# Patient Record
Sex: Female | Born: 1985 | Race: White | Hispanic: No | Smoking: Current every day smoker
Health system: Southern US, Community
[De-identification: ages and names within clinical notes are randomized; demographics above are authoritative.]

## PROBLEM LIST (undated history)

## (undated) DIAGNOSIS — N390 Urinary tract infection, site not specified: Secondary | ICD-10-CM

## (undated) DIAGNOSIS — J45909 Unspecified asthma, uncomplicated: Secondary | ICD-10-CM

## (undated) DIAGNOSIS — F3130 Bipolar disorder, current episode depressed, mild or moderate severity, unspecified: Secondary | ICD-10-CM

## (undated) DIAGNOSIS — F1910 Other psychoactive substance abuse, uncomplicated: Secondary | ICD-10-CM

## (undated) DIAGNOSIS — R011 Cardiac murmur, unspecified: Secondary | ICD-10-CM

## (undated) HISTORY — PX: EXPLORATORY LAPAROTOMY: SUR591

---
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
## 2006-05-23 ENCOUNTER — Emergency Department: Payer: Self-pay | Admitting: Emergency Medicine

## 2007-04-11 ENCOUNTER — Emergency Department: Payer: Self-pay | Admitting: Unknown Physician Specialty

## 2007-04-16 ENCOUNTER — Emergency Department: Payer: Self-pay | Admitting: Emergency Medicine

## 2007-07-09 ENCOUNTER — Emergency Department: Payer: Self-pay | Admitting: Emergency Medicine

## 2007-07-10 ENCOUNTER — Inpatient Hospital Stay: Payer: Self-pay | Admitting: Obstetrics and Gynecology

## 2007-07-23 ENCOUNTER — Ambulatory Visit: Payer: Self-pay | Admitting: Family Medicine

## 2007-09-12 ENCOUNTER — Inpatient Hospital Stay: Payer: Self-pay | Admitting: Obstetrics and Gynecology

## 2007-09-15 ENCOUNTER — Ambulatory Visit: Payer: Self-pay | Admitting: Obstetrics and Gynecology

## 2007-10-25 ENCOUNTER — Observation Stay: Payer: Self-pay

## 2007-11-07 ENCOUNTER — Observation Stay: Payer: Self-pay

## 2007-11-19 ENCOUNTER — Ambulatory Visit: Payer: Self-pay

## 2007-11-20 ENCOUNTER — Inpatient Hospital Stay: Payer: Self-pay

## 2008-03-06 ENCOUNTER — Emergency Department: Payer: Self-pay | Admitting: Internal Medicine

## 2009-05-10 ENCOUNTER — Emergency Department (HOSPITAL_COMMUNITY): Admission: EM | Admit: 2009-05-10 | Discharge: 2009-05-10 | Payer: Self-pay | Admitting: Emergency Medicine

## 2009-06-29 ENCOUNTER — Ambulatory Visit: Payer: Self-pay | Admitting: Obstetrics & Gynecology

## 2009-07-05 ENCOUNTER — Ambulatory Visit: Payer: Self-pay | Admitting: Obstetrics & Gynecology

## 2009-10-25 ENCOUNTER — Emergency Department: Payer: Self-pay | Admitting: Internal Medicine

## 2009-11-23 ENCOUNTER — Ambulatory Visit: Payer: Self-pay | Admitting: Obstetrics & Gynecology

## 2009-12-06 ENCOUNTER — Ambulatory Visit: Payer: Self-pay | Admitting: Obstetrics & Gynecology

## 2010-02-13 ENCOUNTER — Emergency Department: Payer: Self-pay | Admitting: Emergency Medicine

## 2010-08-21 LAB — BASIC METABOLIC PANEL
Calcium: 8.7 mg/dL (ref 8.4–10.5)
GFR calc non Af Amer: >60 mL/min (ref >60)
Potassium: 3.6 mEq/L (ref 3.5–5.1)
Sodium: 139 mEq/L (ref 135–145)

## 2010-08-21 LAB — DIFFERENTIAL
Basophils Relative: 0 % (ref 0–1)
Lymphocytes Relative: 19 % (ref 12–46)
Monocytes Absolute: 0.9 10*3/uL (ref 0.1–1.0)
Monocytes Relative: 8 % (ref 3–12)
Neutro Abs: 8.1 10*3/uL — ABNORMAL HIGH (ref 1.7–7.7)
Neutrophils Relative %: 72 % (ref 43–77)

## 2010-08-21 LAB — CBC
Hemoglobin: 13.3 g/dL (ref 12.0–15.0)
RBC: 4.04 MIL/uL (ref 3.87–5.11)
WBC: 11.2 10*3/uL — ABNORMAL HIGH (ref 4.0–10.5)

## 2010-08-21 LAB — URINALYSIS, ROUTINE W REFLEX MICROSCOPIC
Bilirubin Urine: NEGATIVE
Glucose, UA: NEGATIVE mg/dL
Hgb urine dipstick: NEGATIVE
Ketones, ur: NEGATIVE mg/dL
Nitrite: NEGATIVE
pH: 7.5 (ref 5.0–8.0)

## 2010-08-21 LAB — POCT PREGNANCY, URINE

## 2010-08-21 LAB — URINE MICROSCOPIC-ADD ON

## 2011-06-15 ENCOUNTER — Emergency Department (INDEPENDENT_AMBULATORY_CARE_PROVIDER_SITE_OTHER)
Admission: EM | Admit: 2011-06-15 | Discharge: 2011-06-15 | Disposition: A | Discharge status: Home / Self Care | Payer: Medicaid Other | Source: Home / Self Care | Attending: Family Medicine | Admitting: Family Medicine

## 2011-06-15 ENCOUNTER — Encounter (HOSPITAL_COMMUNITY): Payer: Self-pay | Admitting: *Deleted

## 2011-06-15 DIAGNOSIS — N390 Urinary tract infection, site not specified: Secondary | ICD-10-CM

## 2011-06-15 HISTORY — DX: Urinary tract infection, site not specified: N39.0

## 2011-06-15 LAB — POCT URINALYSIS DIP (DEVICE)
Bilirubin Urine: NEGATIVE
Ketones, ur: NEGATIVE mg/dL
Protein, ur: 100 mg/dL — AB
Specific Gravity, Urine: 1.02 (ref 1.005–1.030)
pH: 8.5 — ABNORMAL HIGH (ref 5.0–8.0)

## 2011-06-15 LAB — POCT PREGNANCY, URINE: Preg Test, Ur: NEGATIVE

## 2011-06-15 MED ORDER — KETOROLAC TROMETHAMINE 30 MG/ML IJ SOLN
30.0000 mg | Freq: Once | INTRAMUSCULAR | Status: AC
  Administered 2011-06-15: 30 mg via INTRAMUSCULAR

## 2011-06-15 MED ORDER — KETOROLAC TROMETHAMINE 30 MG/ML IJ SOLN
INTRAMUSCULAR | Status: AC
  Filled 2011-06-15: qty 1

## 2011-06-15 MED ORDER — CEFTRIAXONE SODIUM 1 G IJ SOLR
INTRAMUSCULAR | Status: AC
  Filled 2011-06-15: qty 10

## 2011-06-15 MED ORDER — ONDANSETRON HCL 4 MG PO TABS: 4.0000 mg | ORAL_TABLET | Freq: Four times a day (QID) | ORAL | Status: AC

## 2011-06-15 MED ORDER — ONDANSETRON 4 MG PO TBDP
ORAL_TABLET | ORAL | Status: AC
  Filled 2011-06-15: qty 1

## 2011-06-15 MED ORDER — CEFTRIAXONE SODIUM 1 G IJ SOLR
1.0000 g | Freq: Once | INTRAMUSCULAR | Status: AC
  Administered 2011-06-15: 1 g via INTRAMUSCULAR

## 2011-06-15 MED ORDER — ONDANSETRON 4 MG PO TBDP
4.0000 mg | ORAL_TABLET | Freq: Once | ORAL | Status: AC
  Administered 2011-06-15: 4 mg via ORAL

## 2011-06-15 MED ORDER — CEPHALEXIN 500 MG PO CAPS: 500.0000 mg | ORAL_CAPSULE | Freq: Four times a day (QID) | ORAL | Status: AC

## 2011-06-15 NOTE — ED Provider Notes (Signed)
History     CSN: [id number]  Arrival date & time 06/15/11  1128   First MD Initiated Contact with Patient 06/15/11 1150      Chief Complaint  Patient presents with  . Urinary Tract Infection    (Consider location/radiation/quality/duration/timing/severity/associated sxs/prior treatment) Patient is a 26 y.o. female presenting with urinary tract infection. The history is provided by the patient.  Urinary Tract Infection This is a new problem. The current episode started more than 2 days ago. The problem occurs constantly. The problem has been gradually worsening. Associated symptoms include abdominal pain.    Past Medical History  Diagnosis Date  . UTI (lower urinary tract infection)     Past Surgical History  Procedure Date  . Cesarean section   . Exploratory laparotomy     History reviewed. No pertinent family history.  History  Substance Use Topics  . Smoking status: Current Everyday Smoker  . Smokeless tobacco: Not on file  . Alcohol Use: No    OB History    Grav Para Term Preterm Abortions TAB SAB Ect Mult Living                  Review of Systems  Constitutional: Positive for chills and appetite change. Negative for fever.  Gastrointestinal: Positive for nausea, vomiting and abdominal pain.  Genitourinary: Positive for dysuria, urgency, frequency and hematuria. Negative for vaginal bleeding, vaginal discharge and vaginal pain.  Skin: Negative.     Allergies  Review of patient's allergies indicates no known allergies.  Home Medications   Current Outpatient Rx  Name Route Sig Dispense Refill  . CEPHALEXIN 500 MG PO CAPS Oral Take 1 capsule (500 mg total) by mouth 4 (four) times daily. Take all of medicine and drink lots of fluids 20 capsule 0  . ONDANSETRON HCL 4 MG PO TABS Oral Take 1 tablet (4 mg total) by mouth every 6 (six) hours. As needed for n/v 6 tablet 0    BP 113/70  Pulse 74  Temp(Src) 98 F (36.7 C) (Oral)  Resp 18  SpO2  99%  Physical Exam  Nursing note and vitals reviewed. Constitutional: She appears well-developed and well-nourished.  HENT:  Head: Normocephalic.  Mouth/Throat: Oropharynx is clear and moist.  Abdominal: Soft. Bowel sounds are normal. She exhibits no mass. There is tenderness in the suprapubic area. There is no rigidity, no rebound, no guarding and no CVA tenderness.    ED Course  Procedures (including critical care time)  Labs Reviewed  POCT URINALYSIS DIP (DEVICE) - Abnormal; Notable for the following:    Hgb urine dipstick MODERATE (*)    pH 8.5 (*)    Protein, ur 100 (*)    Leukocytes, UA SMALL (*) Biochemical Testing Only. Please order routine urinalysis from main lab if confirmatory testing is needed.   All other components within normal limits  POCT PREGNANCY, URINE  POCT URINALYSIS DIPSTICK  POCT PREGNANCY, URINE   No results found.   1. UTI (lower urinary tract infection)       MDM          Barkley Bruns, MD 06/15/11 [phone]

## 2011-06-15 NOTE — ED Notes (Signed)
Pt  Has  Sym[ptoms  Of  Burning  frequencty  Of  Urination  As  Well  As  Low  Back  Pain     X  5  Days

## 2011-10-23 ENCOUNTER — Emergency Department: Payer: Self-pay | Admitting: Emergency Medicine

## 2011-10-23 LAB — URINALYSIS, COMPLETE
Bilirubin,UR: NEGATIVE
Ph: 6 (ref 4.5–8.0)
Specific Gravity: 1.023 (ref 1.003–1.030)
Squamous Epithelial: 6
WBC UR: 6 /HPF (ref 0–5)

## 2011-10-23 LAB — CBC
MCH: 32.8 pg (ref 26.0–34.0)
MCHC: 34.1 g/dL (ref 32.0–36.0)
MCV: 96 fL (ref 80–100)
RBC: 4.39 10*6/uL (ref 3.80–5.20)
WBC: 6.7 10*3/uL (ref 3.6–11.0)

## 2011-10-23 LAB — COMPREHENSIVE METABOLIC PANEL
Anion Gap: 8 (ref 7–16)
Bilirubin,Total: 0.6 mg/dL (ref 0.2–1.0)
Chloride: 106 mmol/L (ref 98–107)
Creatinine: 0.61 mg/dL (ref 0.60–1.30)
EGFR (African American): >60
Glucose: 83 mg/dL (ref 65–99)
Osmolality: 277 (ref 275–301)
SGOT(AST): 22 U/L (ref 15–37)
Sodium: 139 mmol/L (ref 136–145)
Total Protein: 7.4 g/dL (ref 6.4–8.2)

## 2012-01-09 ENCOUNTER — Emergency Department: Payer: Self-pay | Admitting: *Deleted

## 2012-01-09 LAB — URINALYSIS, COMPLETE
Nitrite: NEGATIVE
Protein: NEGATIVE
Specific Gravity: 1.004 (ref 1.003–1.030)
WBC UR: 184 /HPF (ref 0–5)

## 2012-01-09 LAB — BASIC METABOLIC PANEL
Anion Gap: 5 — ABNORMAL LOW (ref 7–16)
BUN: 15 mg/dL (ref 7–18)
Creatinine: 0.68 mg/dL (ref 0.60–1.30)
EGFR (African American): >60
Glucose: 88 mg/dL (ref 65–99)
Osmolality: 282 (ref 275–301)

## 2012-01-09 LAB — CBC
HCT: 41.6 % (ref 35.0–47.0)
Platelet: 208 10*3/uL (ref 150–440)

## 2012-01-11 LAB — URINE CULTURE

## 2012-01-17 ENCOUNTER — Emergency Department: Payer: Self-pay | Admitting: Emergency Medicine

## 2012-01-17 LAB — URINALYSIS, COMPLETE
Blood: NEGATIVE
Glucose,UR: NEGATIVE mg/dL (ref 0–75)
Ketone: NEGATIVE
Nitrite: NEGATIVE
Specific Gravity: 1.02 (ref 1.003–1.030)
Squamous Epithelial: 13
WBC UR: 6 /HPF (ref 0–5)

## 2012-01-17 LAB — PREGNANCY, URINE: Pregnancy Test, Urine: NEGATIVE m[IU]/mL

## 2012-10-20 ENCOUNTER — Emergency Department: Payer: Self-pay | Admitting: Emergency Medicine

## 2012-10-21 LAB — COMPREHENSIVE METABOLIC PANEL
Albumin: 3.9 g/dL (ref 3.4–5.0)
Anion Gap: 5 — ABNORMAL LOW (ref 7–16)
BUN: 12 mg/dL (ref 7–18)
Bilirubin,Total: 0.3 mg/dL (ref 0.2–1.0)
Chloride: 109 mmol/L — ABNORMAL HIGH (ref 98–107)
Creatinine: 0.44 mg/dL — ABNORMAL LOW (ref 0.60–1.30)
EGFR (African American): >60
EGFR (Non-African Amer.): >60
Glucose: 84 mg/dL (ref 65–99)
SGOT(AST): 27 U/L (ref 15–37)
SGPT (ALT): 22 U/L (ref 12–78)
Sodium: 140 mmol/L (ref 136–145)

## 2012-10-21 LAB — CBC
HCT: 38.5 % (ref 35.0–47.0)
HGB: 13.4 g/dL (ref 12.0–16.0)
Platelet: 228 10*3/uL (ref 150–440)
RBC: 4.04 10*6/uL (ref 3.80–5.20)
WBC: 10.3 10*3/uL (ref 3.6–11.0)

## 2013-06-03 IMAGING — CT CT HEAD WITHOUT CONTRAST
1 series · 16 of 29 positions shown, 20 images · non-contrast
Comparison: none

REASON FOR EXAM: headache and dizzines eval
COMMENTS:

[Series 2: soft tissue · axial · 0.42mm/px · z∈[+336,+466]mm · 16 of 29 slices shown, 20 images]
[im 2/29  brain]
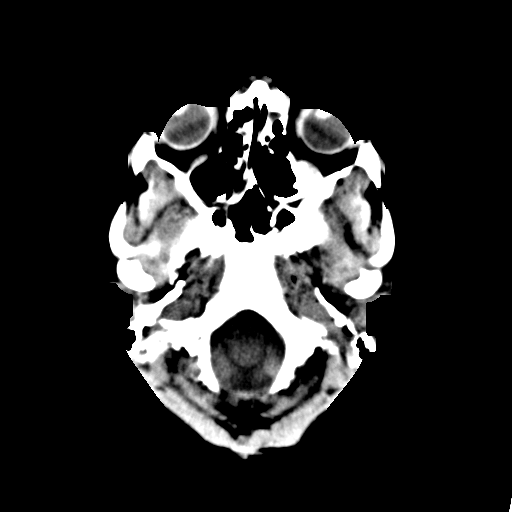
[im 2/29  bone]
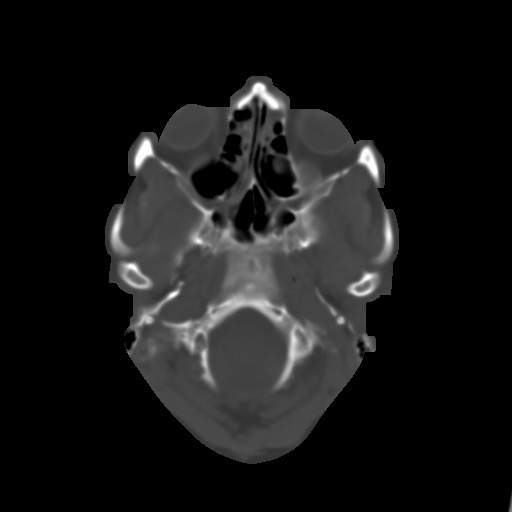
[im 4/29  brain]
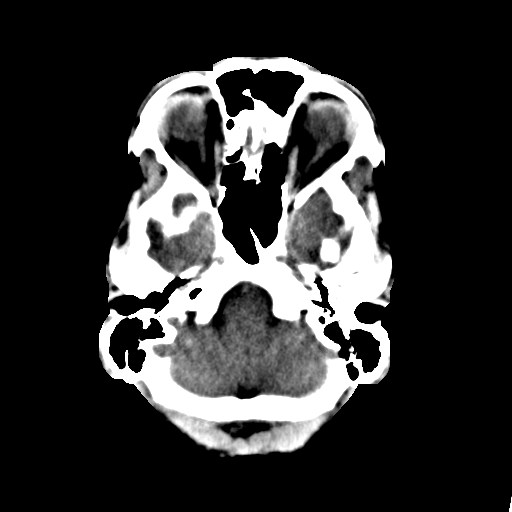
[im 6/29  brain]
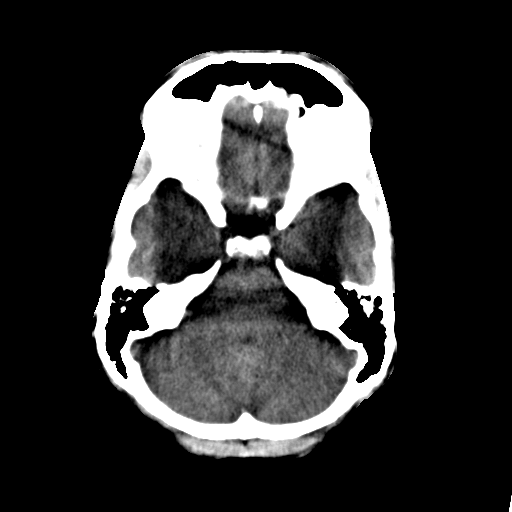
[im 7/29  brain]
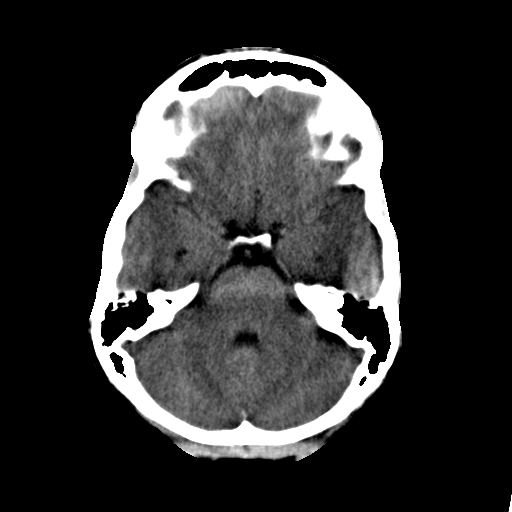
[im 9/29  brain]
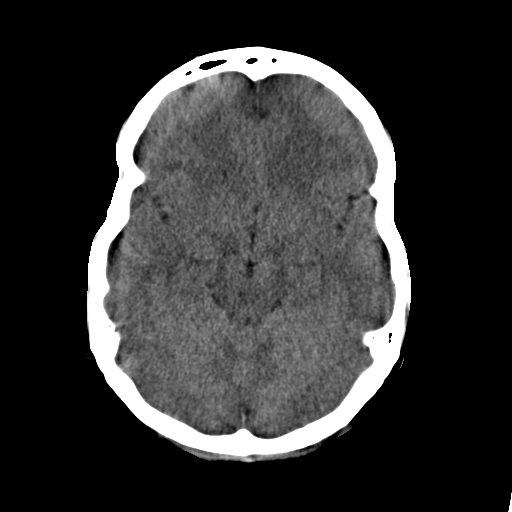
[im 9/29  bone]
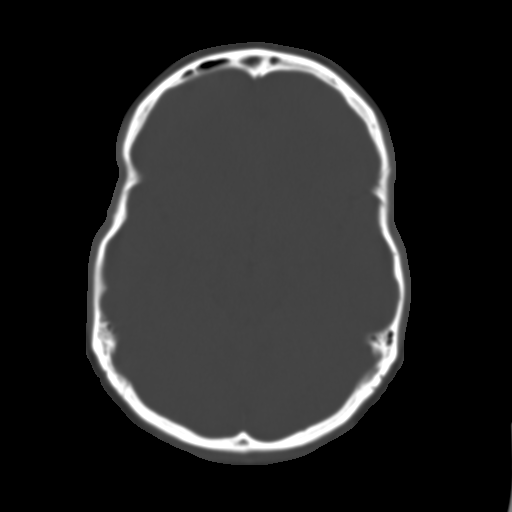
[im 11/29  brain]
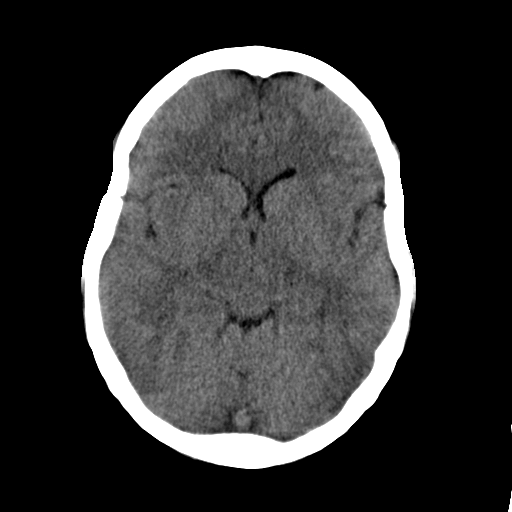
[im 12/29  brain]
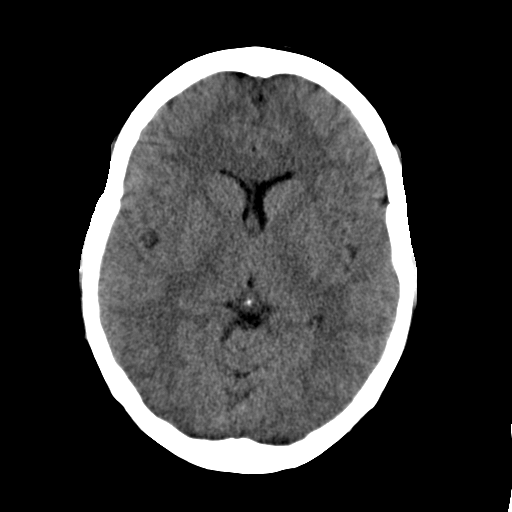
[im 14/29  brain]
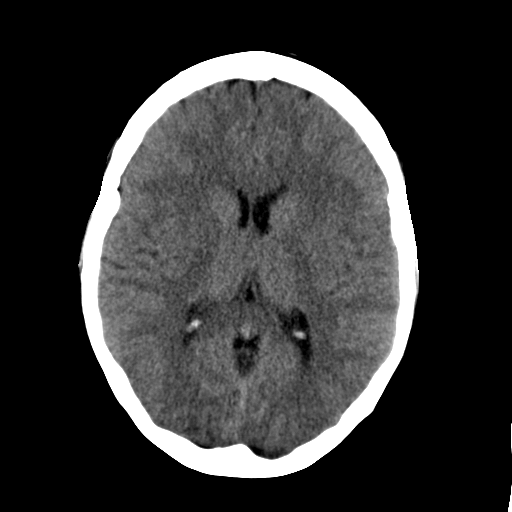
[im 16/29  brain]
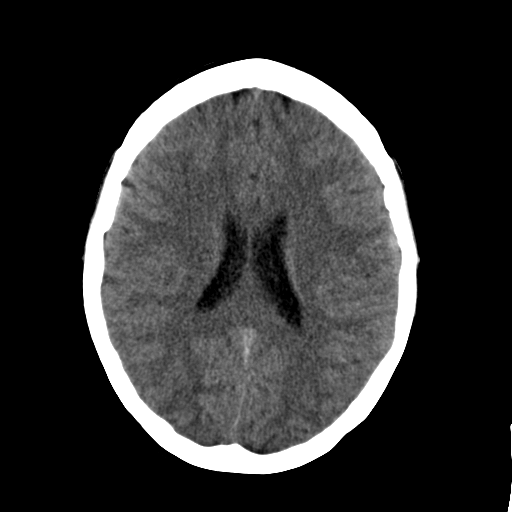
[im 16/29  bone]
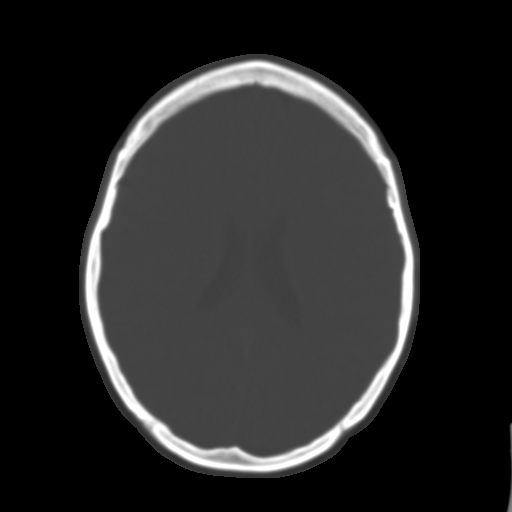
[im 18/29  brain]
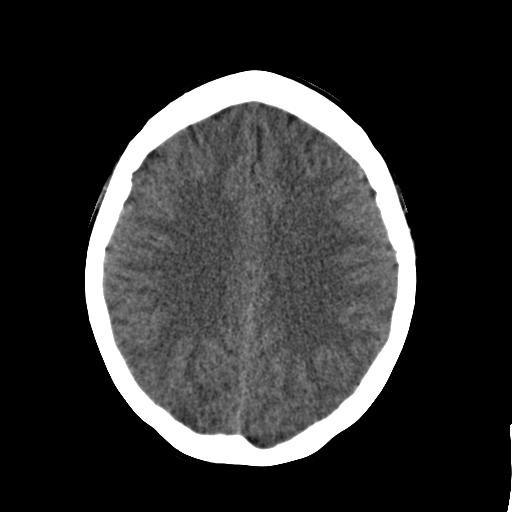
[im 19/29  brain]
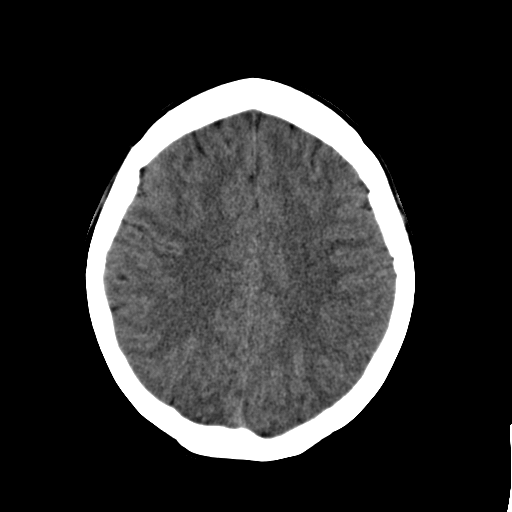
[im 21/29  brain]
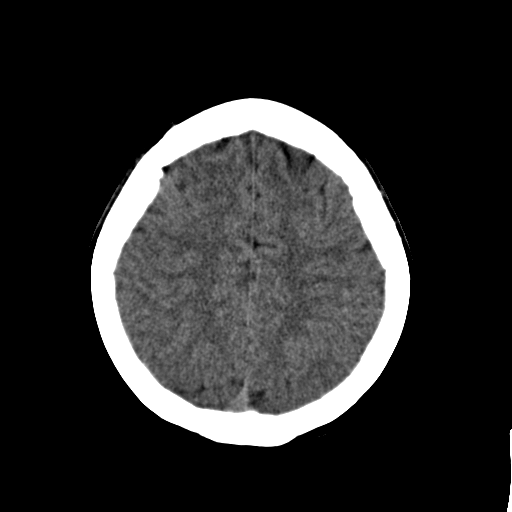
[im 23/29  brain]
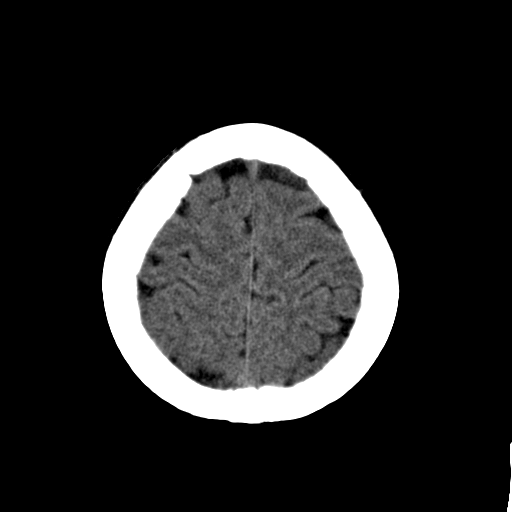
[im 23/29  bone]
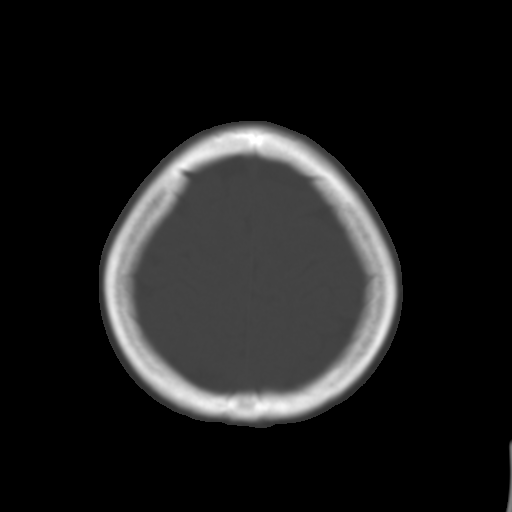
[im 24/29  brain]
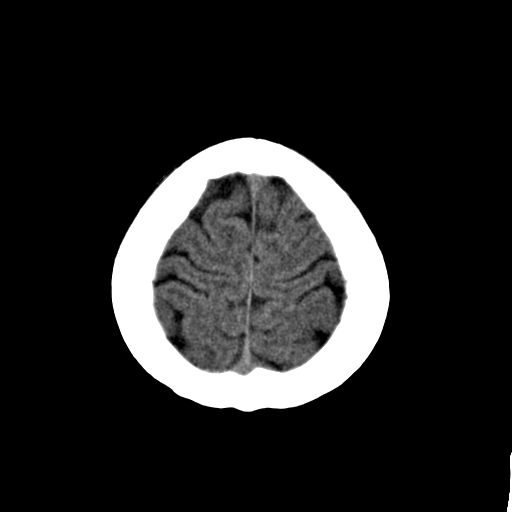
[im 26/29  brain]
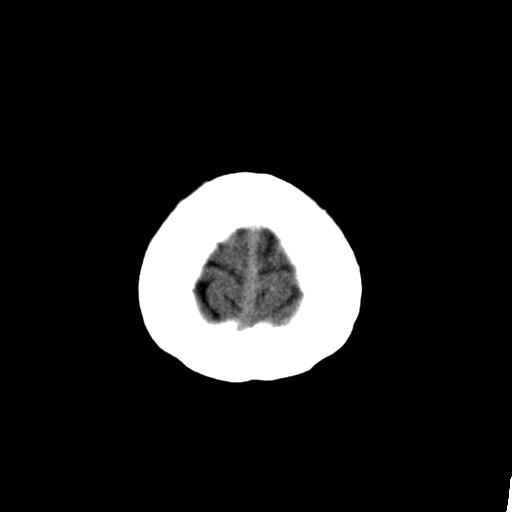
[im 28/29  brain]
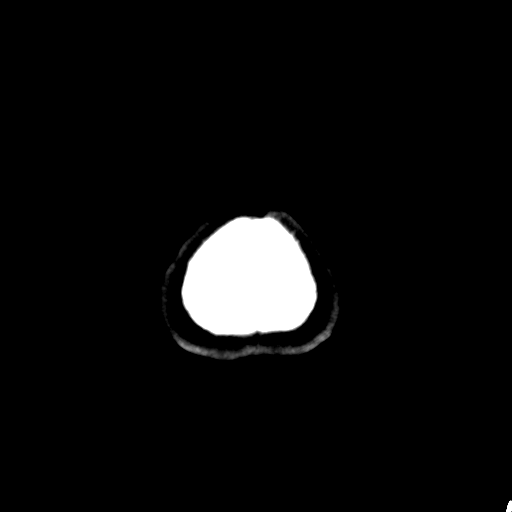

[16 of 29 positions shown; findings below may reference images not displayed]

PROCEDURE:     CT  - CT HEAD WITHOUT CONTRAST  - October 21, 2012  [DATE]

RESULT:     Axial noncontrast CT scanning was performed through the brain
with reconstructions at 5 mm intervals and slice thicknesses.

The ventricles are normal in size and position. There is no intracranial
hemorrhage nor intracranial mass effect. There is no evidence of an evolving
ischemic event. The cerebellum and brainstem are normal in density.

At bone window settings there is an air-fluid level in the left maxillary
sinus. The right maxillary sinus is clear. There is mucoperiosteal
thickening within the ethmoid sinus cells. The frontal and sphenoid and
mastoid sinus cells are clear. There is no evidence of an acute skull
fracture.
IMPRESSION: 1. The findings are consistent with acute sinusitis involving the left
maxillary sinus with an air-fluid level present. There is also inflammatory
change of the ethmoid sinuses.
2. No abnormality of the brain is demonstrated.

[REDACTED]

## 2014-09-18 ENCOUNTER — Emergency Department
Admit: 2014-09-18 | Disposition: A | Discharge status: Home / Self Care | Payer: Self-pay | Admitting: Emergency Medicine

## 2014-09-18 LAB — CBC WITH DIFFERENTIAL/PLATELET
BASOS ABS: 0 10*3/uL (ref 0.0–0.1)
Basophil %: 0.2 %
EOS ABS: 0.1 10*3/uL (ref 0.0–0.7)
Eosinophil %: 0.9 %
HCT: 45.3 % (ref 35.0–47.0)
HGB: 15.1 g/dL (ref 12.0–16.0)
Lymphocyte #: 2.9 10*3/uL (ref 1.0–3.6)
Lymphocyte %: 28.5 %
MCH: 30.9 pg (ref 26.0–34.0)
MCHC: 33.4 g/dL (ref 32.0–36.0)
MCV: 92 fL (ref 80–100)
Monocyte #: 0.9 x10 3/mm (ref 0.2–0.9)
Monocyte %: 8.7 %
Neutrophil #: 6.3 10*3/uL (ref 1.4–6.5)
Neutrophil %: 61.7 %
Platelet: 260 10*3/uL (ref 150–440)
RBC: 4.9 10*6/uL (ref 3.80–5.20)
RDW: 12.5 % (ref 11.5–14.5)
WBC: 10.1 10*3/uL (ref 3.6–11.0)

## 2014-09-18 LAB — COMPREHENSIVE METABOLIC PANEL
ALBUMIN: 5.2 g/dL — AB
ALT: 20 U/L
Alkaline Phosphatase: 88 U/L
Anion Gap: 7 (ref 7–16)
BUN: 14 mg/dL
Bilirubin,Total: 0.6 mg/dL
CALCIUM: 9.4 mg/dL
CHLORIDE: 105 mmol/L
CO2: 27 mmol/L
Creatinine: 0.68 mg/dL
EGFR (African American): >60
EGFR (Non-African Amer.): >60
Glucose: 64 mg/dL — ABNORMAL LOW
Potassium: 3.9 mmol/L
SGOT(AST): 21 U/L
SODIUM: 139 mmol/L
Total Protein: 8.3 g/dL — ABNORMAL HIGH

## 2014-09-18 LAB — URINALYSIS, COMPLETE
Bacteria: NONE SEEN
Bilirubin,UR: NEGATIVE
Blood: NEGATIVE
Glucose,UR: NEGATIVE mg/dL (ref 0–75)
Ketone: NEGATIVE
NITRITE: NEGATIVE
PH: 6 (ref 4.5–8.0)
Protein: NEGATIVE
Specific Gravity: 1.024 (ref 1.003–1.030)

## 2015-05-01 IMAGING — US US PELV - US TRANSVAGINAL
1 series · 13 of 25 positions shown · non-contrast
Comparison: None

CLINICAL DATA: Left lower quadrant pain for 1 month, history of 3
Cesarean sections, history of endometriosis

EXAM:
TRANSABDOMINAL AND TRANSVAGINAL ULTRASOUND OF PELVIS
TECHNIQUE: Both transabdominal and transvaginal ultrasound examinations of the
pelvis were performed. Transabdominal technique was performed for
global imaging of the pelvis including uterus, ovaries, adnexal
regions, and pelvic cul-de-sac. It was necessary to proceed with
endovaginal exam following the transabdominal exam to visualize the
endometrium and ovaries.

[Series 1: us pelv - us transvaginal · 0.15mm/px · 13 of 187 slices shown]
[im 1/187]
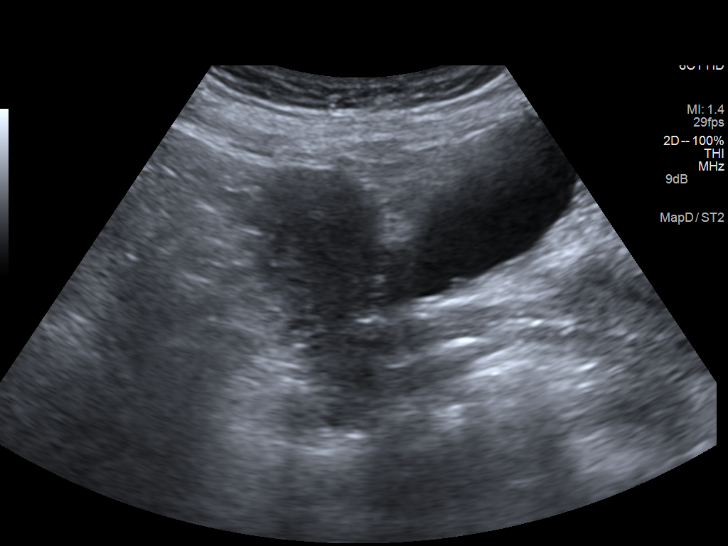
[im 16/187]
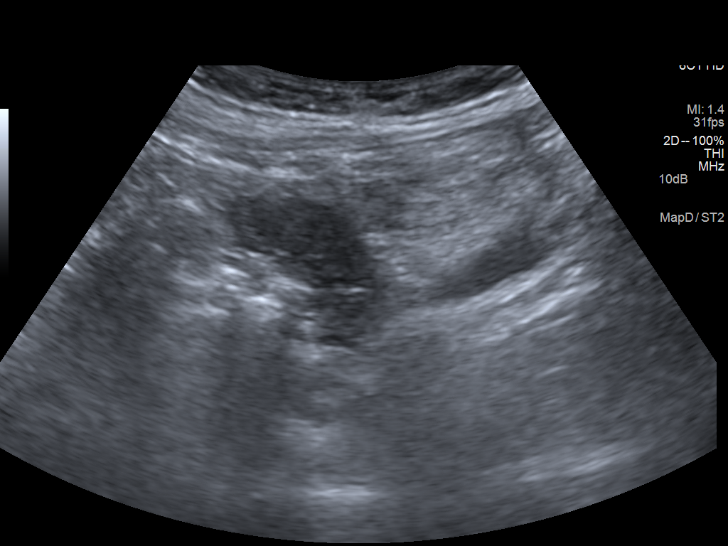
[im 32/187]
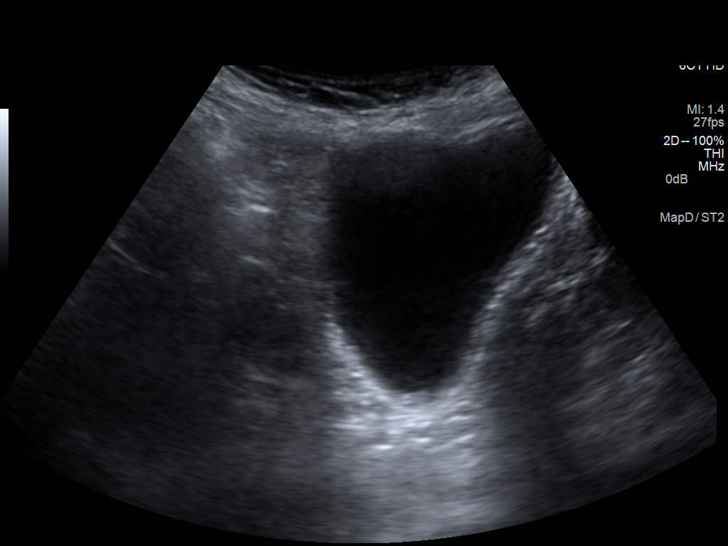
[im 47/187]
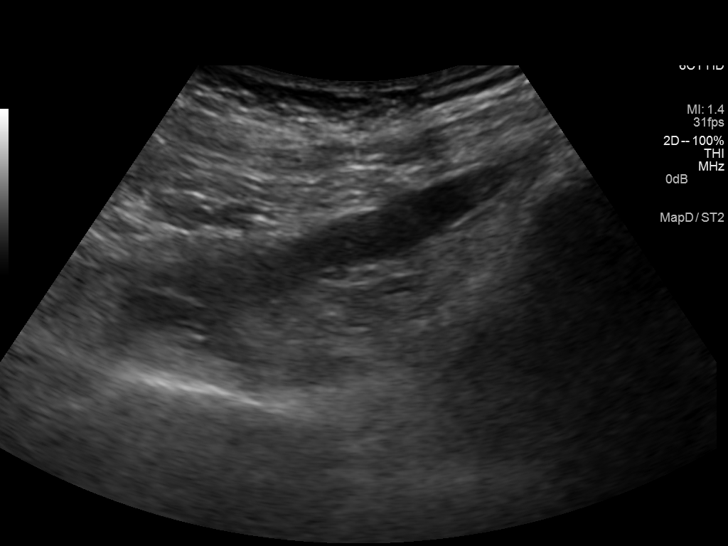
[im 63/187]
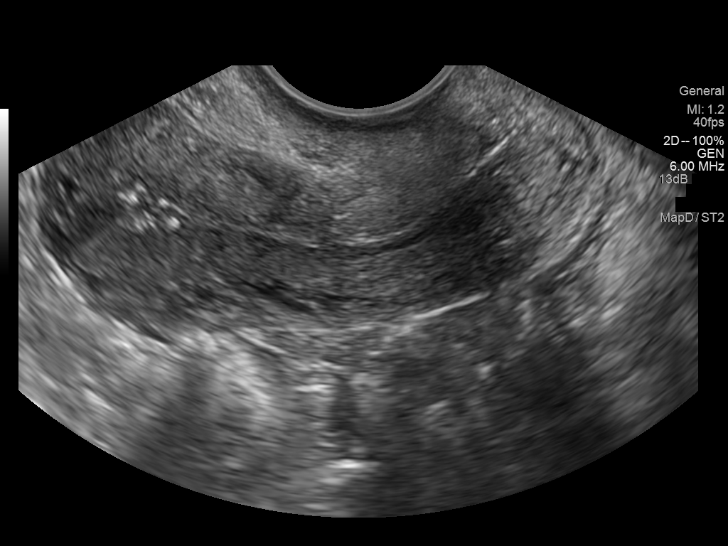
[im 78/187]
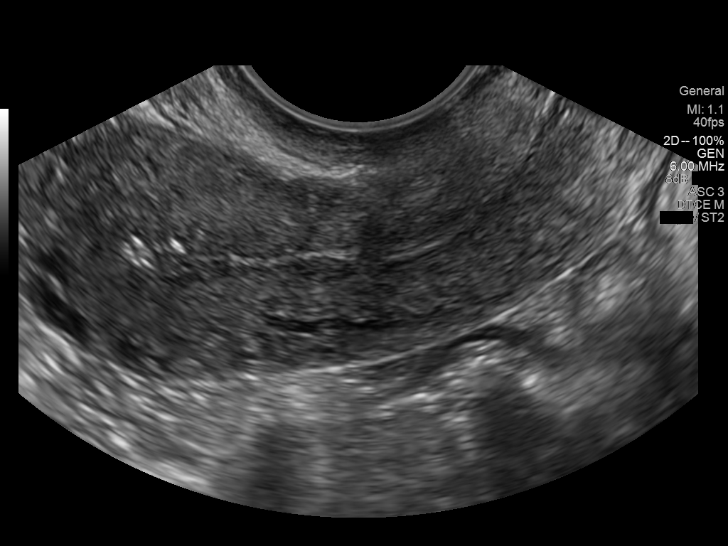
[im 94/187]
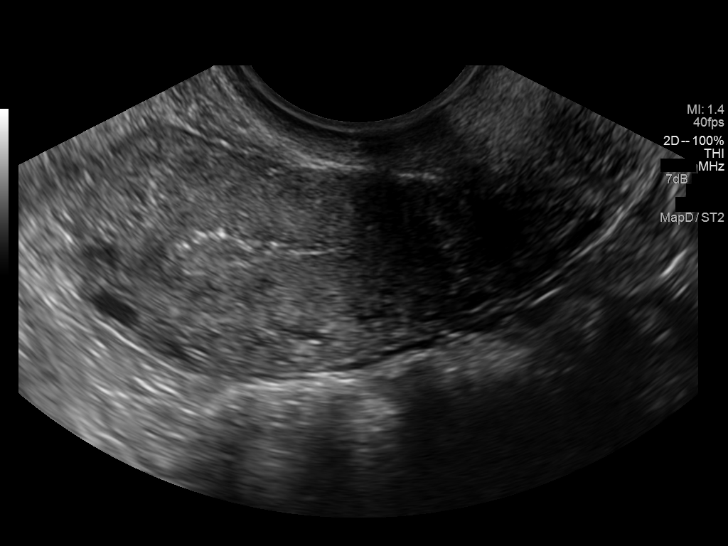
[im 109/187]
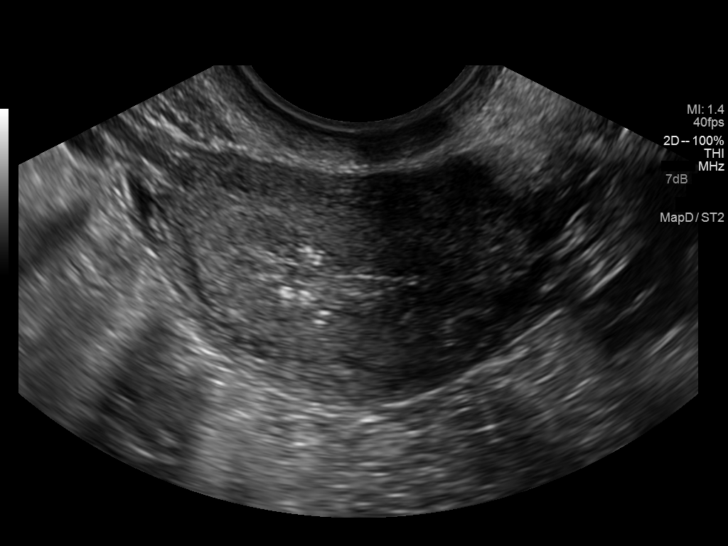
[im 125/187]
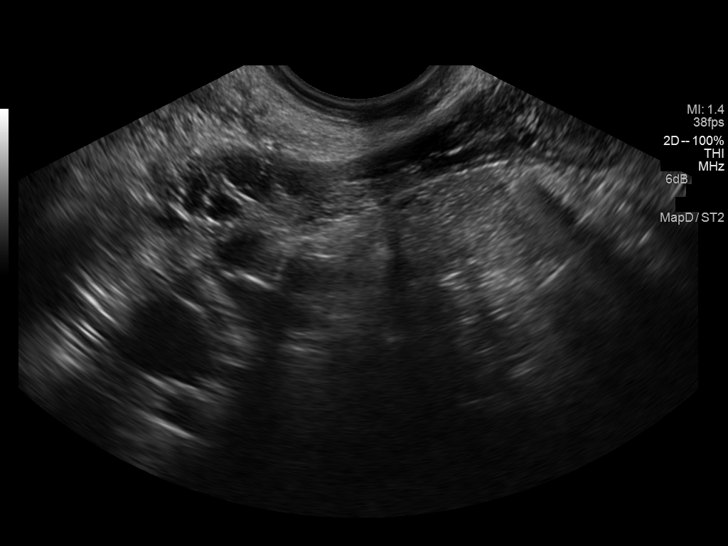
[im 140/187]
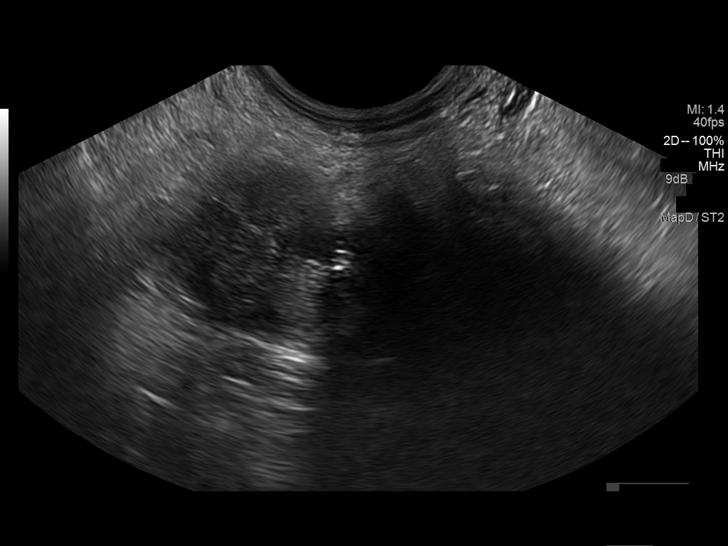
[im 156/187]
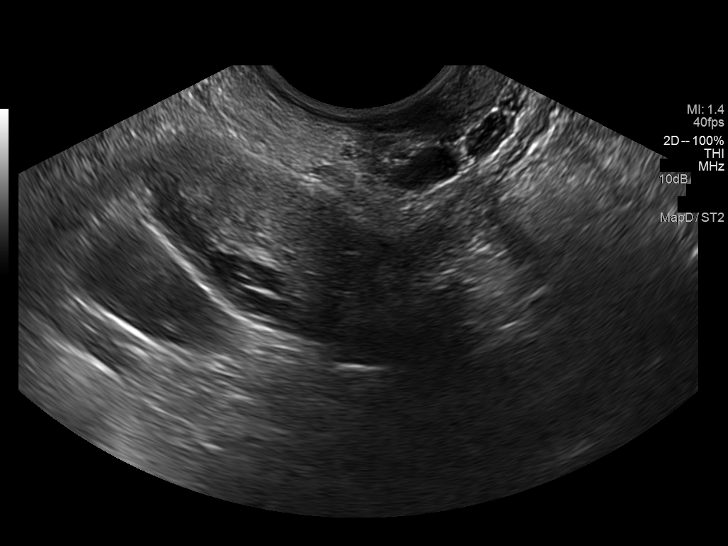
[im 171/187]
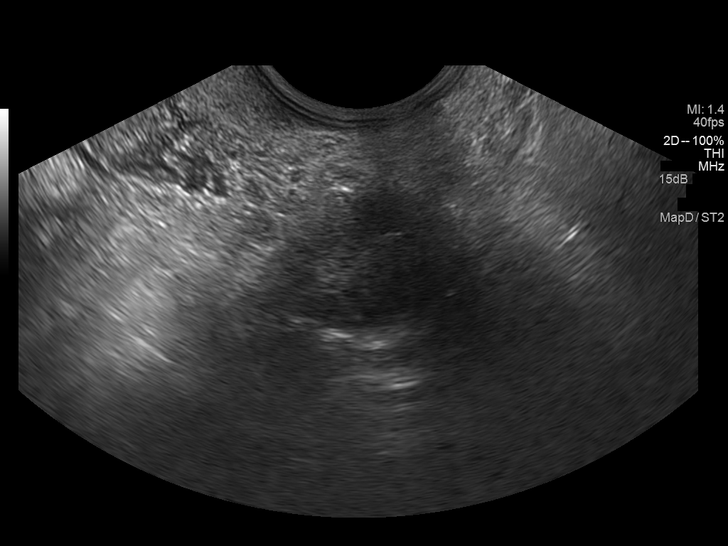
[im 187/187]
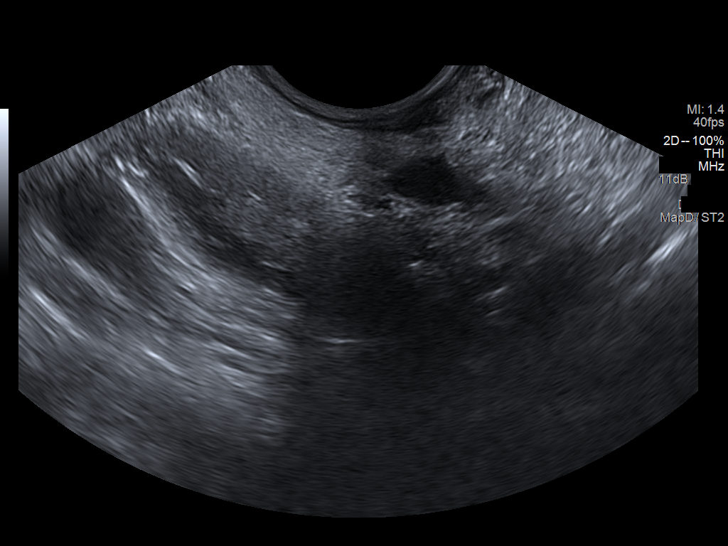

[13 of 25 positions shown; findings below may reference images not displayed]

FINDINGS: Uterus

Measurements: 6.1 x 2.8 x 3.9 cm. No fibroids or other mass
visualized.

Endometrium

Thickness: 5.5 mm. Multiple punctate areas of increased echogenicity
in the endometrium and the sub endometrium suggesting calcification

Right ovary

Measurements: 41 x 17 x 20 mm. Normal appearance/no adnexal mass.

Left ovary

Measurements: 40 x 17 x 26 mm. Normal appearance/no adnexal mass.

Other findings

No free fluid.
IMPRESSION: Endometrial calcifications.This is a nonspecific finding, generally
of benign etiology, although is somewhat unusual for the patient's
age. Consider nonemergent gynecological consultation.

## 2015-05-11 ENCOUNTER — Emergency Department
Admission: EM | Admit: 2015-05-11 | Discharge: 2015-05-11 | Disposition: A | Discharge status: Home / Self Care | Payer: Medicaid Other | Attending: Emergency Medicine | Admitting: Emergency Medicine

## 2015-05-11 DIAGNOSIS — L03114 Cellulitis of left upper limb: Secondary | ICD-10-CM | POA: Diagnosis not present

## 2015-05-11 DIAGNOSIS — Z79899 Other long term (current) drug therapy: Secondary | ICD-10-CM | POA: Diagnosis not present

## 2015-05-11 DIAGNOSIS — F1310 Sedative, hypnotic or anxiolytic abuse, uncomplicated: Secondary | ICD-10-CM | POA: Insufficient documentation

## 2015-05-11 DIAGNOSIS — F17200 Nicotine dependence, unspecified, uncomplicated: Secondary | ICD-10-CM | POA: Insufficient documentation

## 2015-05-11 DIAGNOSIS — F1990 Other psychoactive substance use, unspecified, uncomplicated: Secondary | ICD-10-CM

## 2015-05-11 DIAGNOSIS — L089 Local infection of the skin and subcutaneous tissue, unspecified: Secondary | ICD-10-CM | POA: Diagnosis present

## 2015-05-11 LAB — CBC WITH DIFFERENTIAL/PLATELET
BASOS ABS: 0 10*3/uL (ref 0–0.1)
BASOS PCT: 0 %
EOS ABS: 0.2 10*3/uL (ref 0–0.7)
Eosinophils Relative: 3 %
HCT: 43 % (ref 35.0–47.0)
Hemoglobin: 14.2 g/dL (ref 12.0–16.0)
Lymphocytes Relative: 16 %
Lymphs Abs: 1.4 10*3/uL (ref 1.0–3.6)
MCH: 31.2 pg (ref 26.0–34.0)
MCHC: 33 g/dL (ref 32.0–36.0)
MCV: 94.3 fL (ref 80.0–100.0)
MONO ABS: 0.6 10*3/uL (ref 0.2–0.9)
MONOS PCT: 7 %
Neutro Abs: 6.6 10*3/uL — ABNORMAL HIGH (ref 1.4–6.5)
Neutrophils Relative %: 74 %
Platelets: 199 10*3/uL (ref 150–440)
RBC: 4.56 MIL/uL (ref 3.80–5.20)
RDW: 12.7 % (ref 11.5–14.5)
WBC: 8.8 10*3/uL (ref 3.6–11.0)

## 2015-05-11 LAB — BASIC METABOLIC PANEL
Anion gap: 6 (ref 5–15)
BUN: 13 mg/dL (ref 6–20)
CALCIUM: 9 mg/dL (ref 8.9–10.3)
CO2: 26 mmol/L (ref 22–32)
Chloride: 108 mmol/L (ref 101–111)
Creatinine, Ser: 0.74 mg/dL (ref 0.44–1.00)
GFR calc non Af Amer: >60 mL/min (ref >60)
Glucose, Bld: 87 mg/dL (ref 65–99)
Potassium: 3.5 mmol/L (ref 3.5–5.1)
SODIUM: 140 mmol/L (ref 135–145)

## 2015-05-11 MED ORDER — OXYCODONE-ACETAMINOPHEN 5-325 MG PO TABS
1.0000 | ORAL_TABLET | Freq: Once | ORAL | Status: AC
  Administered 2015-05-11: 1 via ORAL
  Filled 2015-05-11: qty 1

## 2015-05-11 MED ORDER — CLINDAMYCIN PHOSPHATE 600 MG/50ML IV SOLN
600.0000 mg | Freq: Once | INTRAVENOUS | Status: AC
  Administered 2015-05-11: 600 mg via INTRAVENOUS
  Filled 2015-05-11: qty 50

## 2015-05-11 MED ORDER — CLINDAMYCIN HCL 150 MG PO CAPS: 450.0000 mg | ORAL_CAPSULE | Freq: Four times a day (QID) | ORAL | Status: DC
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
## 2015-05-11 NOTE — ED Notes (Signed)
Notified to run CBC and CMP, states found the blood and will run.

## 2015-05-11 NOTE — ED Notes (Signed)
Pt trying to obtain urine sample.
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
## 2015-05-11 NOTE — Discharge Instructions (Signed)

## 2015-05-11 NOTE — ED Notes (Signed)
Pt request food try, lunch box and orange/apple juice provided.

## 2015-05-11 NOTE — ED Notes (Signed)
Pt want to use restroom in hallway. Took her pocket book with her to bathroom. Pt requests to go outside, informed that pt not allow to go outside, especially when they're on IV drips. Pt verbalize understanding.

## 2015-05-16 LAB — CULTURE, BLOOD (ROUTINE X 2)
Culture: NO GROWTH
Culture: NO GROWTH

## 2015-07-10 ENCOUNTER — Encounter: Payer: Self-pay | Admitting: Emergency Medicine

## 2015-07-10 ENCOUNTER — Emergency Department
Admission: EM | Admit: 2015-07-10 | Discharge: 2015-07-11 | Disposition: A | Discharge status: Other Acute Inpatient Hospital | Payer: Medicaid Other | Attending: Emergency Medicine | Admitting: Emergency Medicine

## 2015-07-10 DIAGNOSIS — F1310 Sedative, hypnotic or anxiolytic abuse, uncomplicated: Secondary | ICD-10-CM | POA: Diagnosis not present

## 2015-07-10 DIAGNOSIS — F1410 Cocaine abuse, uncomplicated: Secondary | ICD-10-CM | POA: Diagnosis not present

## 2015-07-10 DIAGNOSIS — F17200 Nicotine dependence, unspecified, uncomplicated: Secondary | ICD-10-CM | POA: Diagnosis present

## 2015-07-10 DIAGNOSIS — F329 Major depressive disorder, single episode, unspecified: Secondary | ICD-10-CM | POA: Diagnosis not present

## 2015-07-10 DIAGNOSIS — F1210 Cannabis abuse, uncomplicated: Secondary | ICD-10-CM | POA: Diagnosis not present

## 2015-07-10 DIAGNOSIS — Z79899 Other long term (current) drug therapy: Secondary | ICD-10-CM | POA: Insufficient documentation

## 2015-07-10 DIAGNOSIS — F1420 Cocaine dependence, uncomplicated: Secondary | ICD-10-CM | POA: Diagnosis present

## 2015-07-10 DIAGNOSIS — Z3202 Encounter for pregnancy test, result negative: Secondary | ICD-10-CM | POA: Diagnosis not present

## 2015-07-10 DIAGNOSIS — Z792 Long term (current) use of antibiotics: Secondary | ICD-10-CM | POA: Insufficient documentation

## 2015-07-10 DIAGNOSIS — T5791XA Toxic effect of unspecified inorganic substance, accidental (unintentional), initial encounter: Secondary | ICD-10-CM

## 2015-07-10 DIAGNOSIS — T43222A Poisoning by selective serotonin reuptake inhibitors, intentional self-harm, initial encounter: Secondary | ICD-10-CM | POA: Insufficient documentation

## 2015-07-10 DIAGNOSIS — F1120 Opioid dependence, uncomplicated: Secondary | ICD-10-CM | POA: Diagnosis present

## 2015-07-10 DIAGNOSIS — F1220 Cannabis dependence, uncomplicated: Secondary | ICD-10-CM | POA: Diagnosis present

## 2015-07-10 LAB — CBC WITH DIFFERENTIAL/PLATELET
BASOS PCT: 0 %
Basophils Absolute: 0 10*3/uL (ref 0–0.1)
EOS ABS: 0.1 10*3/uL (ref 0–0.7)
Eosinophils Relative: 1 %
HCT: 38.5 % (ref 35.0–47.0)
Hemoglobin: 13 g/dL (ref 12.0–16.0)
Lymphocytes Relative: 12 %
Lymphs Abs: 1.2 10*3/uL (ref 1.0–3.6)
MCH: 30.9 pg (ref 26.0–34.0)
MCHC: 33.7 g/dL (ref 32.0–36.0)
MCV: 91.7 fL (ref 80.0–100.0)
MONO ABS: 0.6 10*3/uL (ref 0.2–0.9)
Monocytes Relative: 6 %
Neutro Abs: 8.3 10*3/uL — ABNORMAL HIGH (ref 1.4–6.5)
Neutrophils Relative %: 81 %
PLATELETS: 191 10*3/uL (ref 150–440)
RBC: 4.2 MIL/uL (ref 3.80–5.20)
RDW: 12.9 % (ref 11.5–14.5)
WBC: 10.2 10*3/uL (ref 3.6–11.0)

## 2015-07-10 LAB — COMPREHENSIVE METABOLIC PANEL
ALT: 16 U/L (ref 14–54)
AST: 19 U/L (ref 15–41)
Albumin: 4.3 g/dL (ref 3.5–5.0)
Alkaline Phosphatase: 77 U/L (ref 38–126)
Anion gap: 7 (ref 5–15)
BILIRUBIN TOTAL: 0.5 mg/dL (ref 0.3–1.2)
BUN: 13 mg/dL (ref 6–20)
CO2: 25 mmol/L (ref 22–32)
CREATININE: 0.65 mg/dL (ref 0.44–1.00)
Calcium: 9.2 mg/dL (ref 8.9–10.3)
Chloride: 108 mmol/L (ref 101–111)
GFR calc Af Amer: >60 mL/min (ref >60)
Glucose, Bld: 101 mg/dL — ABNORMAL HIGH (ref 65–99)
Potassium: 3.9 mmol/L (ref 3.5–5.1)
Sodium: 140 mmol/L (ref 135–145)
TOTAL PROTEIN: 7.3 g/dL (ref 6.5–8.1)

## 2015-07-10 LAB — ACETAMINOPHEN LEVEL

## 2015-07-10 LAB — URINE DRUG SCREEN, QUALITATIVE (ARMC ONLY)
AMPHETAMINES, UR SCREEN: NOT DETECTED
BENZODIAZEPINE, UR SCRN: POSITIVE — AB
Barbiturates, Ur Screen: NOT DETECTED
COCAINE METABOLITE, UR ~~LOC~~: POSITIVE — AB
Cannabinoid 50 Ng, Ur ~~LOC~~: POSITIVE — AB
MDMA (ECSTASY) UR SCREEN: NOT DETECTED
METHADONE SCREEN, URINE: NOT DETECTED
OPIATE, UR SCREEN: NOT DETECTED
PHENCYCLIDINE (PCP) UR S: NOT DETECTED
Tricyclic, Ur Screen: NOT DETECTED

## 2015-07-10 LAB — POCT PREGNANCY, URINE: Preg Test, Ur: NEGATIVE

## 2015-07-10 LAB — ETHANOL

## 2015-07-10 LAB — SALICYLATE LEVEL: Salicylate Lvl: <4 mg/dL (ref 2.8–30.0)

## 2015-07-10 MED ORDER — ACETAMINOPHEN 500 MG PO TABS
1000.0000 mg | ORAL_TABLET | Freq: Once | ORAL | Status: AC
  Administered 2015-07-10: 1000 mg via ORAL
  Filled 2015-07-10: qty 2

## 2015-07-10 MED ORDER — DIPHENHYDRAMINE HCL 25 MG PO CAPS
25.0000 mg | ORAL_CAPSULE | Freq: Once | ORAL | Status: AC
  Administered 2015-07-10: 25 mg via ORAL
  Filled 2015-07-10: qty 1

## 2015-07-10 NOTE — ED Notes (Signed)
ED BHU PLACEMENT JUSTIFICATION Is the patient under IVC or is there intent for IVC: Yes.   Is the patient medically cleared: Yes.   Is there vacancy in the ED BHU: Yes.   Is the population mix appropriate for patient: Yes.   Is the patient awaiting placement in inpatient or outpatient setting: Yes.   Has the patient had a psychiatric consult: No. Survey of unit performed for contraband, proper placement and condition of furniture, tampering with fixtures in bathroom, shower, and each patient room: Yes.  ; Findings:  APPEARANCE/BEHAVIOR calm, cooperative and adequate rapport can be established NEURO ASSESSMENT Orientation: time, place and person Hallucinations: No.None noted (Hallucinations) Speech: Normal Gait: normal RESPIRATORY ASSESSMENT Normal expansion.  Clear to auscultation.  No rales, rhonchi, or wheezing. CARDIOVASCULAR ASSESSMENT regular rate and rhythm, S1, S2 normal, no murmur, click, rub or gallop GASTROINTESTINAL ASSESSMENT soft, nontender, BS WNL, no r/g EXTREMITIES normal strength, tone, and muscle mass PLAN OF CARE Provide calm/safe environment. Vital signs assessed twice daily. ED BHU Assessment once each 12-hour shift. Collaborate with intake RN daily or as condition indicates. Assure the ED provider has rounded once each shift. Provide and encourage hygiene. Provide redirection as needed. Assess for escalating behavior; address immediately and inform ED provider.  Assess family dynamic and appropriateness for visitation as needed: Yes.  ; If necessary, describe findings:  Educate the patient/family about BHU procedures/visitation: Yes.  ; If necessary, describe findings:  

## 2015-07-10 NOTE — ED Notes (Signed)
Attempted to call poison control again. Still ringing busy.
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
## 2015-07-10 NOTE — ED Notes (Signed)
This RN made aware by ED secretory that family does not want [facility] to [facility]. Family states they gave her a rx for Xanax and that patient has been crushing them and snorting them.

## 2015-07-10 NOTE — ED Notes (Signed)
Attempted to call poison control. Line ringing busy at this time. Will try again later.

## 2015-07-10 NOTE — ED Notes (Signed)
Poison control called back to check on pt. Labs and vitals.  Information given.

## 2015-07-10 NOTE — ED Notes (Signed)
Poison controled called and notified. They state we need to watch for seizures and hypotension. They state we need to watch her for 6 hours from the time she took the zoloft.
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
## 2015-07-10 NOTE — ED Notes (Signed)
Took 10 zoloft  pills, and an unknown sleeping pill

## 2015-07-10 NOTE — ED Notes (Signed)
Patient assigned to appropriate care area. Patient oriented to unit/care area: Informed that, for their safety, care areas are designed for safety and monitored by security cameras at all times; and visiting hours explained to patient. Patient verbalizes understanding, and verbal contract for safety obtained. 

## 2015-07-10 NOTE — ED Notes (Signed)

## 2015-07-11 ENCOUNTER — Encounter: Payer: Self-pay | Admitting: Psychiatry

## 2015-07-11 ENCOUNTER — Inpatient Hospital Stay
Admission: EM | Admit: 2015-07-11 | Discharge: 2015-07-12 | DRG: 885 | Disposition: A | Discharge status: Home / Self Care | Payer: Medicaid Other | Source: Intra-hospital | Attending: Psychiatry | Admitting: Psychiatry

## 2015-07-11 DIAGNOSIS — F1320 Sedative, hypnotic or anxiolytic dependence, uncomplicated: Secondary | ICD-10-CM | POA: Diagnosis present

## 2015-07-11 DIAGNOSIS — F17200 Nicotine dependence, unspecified, uncomplicated: Secondary | ICD-10-CM | POA: Diagnosis present

## 2015-07-11 DIAGNOSIS — F429 Obsessive-compulsive disorder, unspecified: Secondary | ICD-10-CM | POA: Diagnosis present

## 2015-07-11 DIAGNOSIS — F1310 Sedative, hypnotic or anxiolytic abuse, uncomplicated: Secondary | ICD-10-CM | POA: Diagnosis present

## 2015-07-11 DIAGNOSIS — Z9889 Other specified postprocedural states: Secondary | ICD-10-CM | POA: Diagnosis not present

## 2015-07-11 DIAGNOSIS — F1420 Cocaine dependence, uncomplicated: Secondary | ICD-10-CM | POA: Diagnosis present

## 2015-07-11 DIAGNOSIS — F17210 Nicotine dependence, cigarettes, uncomplicated: Secondary | ICD-10-CM | POA: Diagnosis present

## 2015-07-11 DIAGNOSIS — Z818 Family history of other mental and behavioral disorders: Secondary | ICD-10-CM

## 2015-07-11 DIAGNOSIS — F314 Bipolar disorder, current episode depressed, severe, without psychotic features: Principal | ICD-10-CM | POA: Diagnosis present

## 2015-07-11 DIAGNOSIS — G4700 Insomnia, unspecified: Secondary | ICD-10-CM | POA: Diagnosis present

## 2015-07-11 DIAGNOSIS — Z915 Personal history of self-harm: Secondary | ICD-10-CM

## 2015-07-11 DIAGNOSIS — F1120 Opioid dependence, uncomplicated: Secondary | ICD-10-CM | POA: Diagnosis present

## 2015-07-11 DIAGNOSIS — F410 Panic disorder [episodic paroxysmal anxiety] without agoraphobia: Secondary | ICD-10-CM | POA: Diagnosis present

## 2015-07-11 DIAGNOSIS — F4310 Post-traumatic stress disorder, unspecified: Secondary | ICD-10-CM | POA: Diagnosis present

## 2015-07-11 DIAGNOSIS — F4010 Social phobia, unspecified: Secondary | ICD-10-CM | POA: Diagnosis present

## 2015-07-11 DIAGNOSIS — F1220 Cannabis dependence, uncomplicated: Secondary | ICD-10-CM | POA: Diagnosis present

## 2015-07-11 DIAGNOSIS — R45851 Suicidal ideations: Secondary | ICD-10-CM | POA: Diagnosis present

## 2015-07-11 DIAGNOSIS — T43222A Poisoning by selective serotonin reuptake inhibitors, intentional self-harm, initial encounter: Secondary | ICD-10-CM | POA: Diagnosis not present

## 2015-07-11 LAB — LIPID PANEL
CHOL/HDL RATIO: 3 ratio
CHOLESTEROL: 181 mg/dL (ref 0–200)
HDL: 60 mg/dL (ref >40)
LDL CALC: 111 mg/dL — AB (ref 0–99)
TRIGLYCERIDES: 48 mg/dL (ref <150)
VLDL: 10 mg/dL (ref 0–40)

## 2015-07-11 LAB — TSH: TSH: 0.728 u[IU]/mL (ref 0.350–4.500)

## 2015-07-11 LAB — URINALYSIS COMPLETE WITH MICROSCOPIC (ARMC ONLY)
BACTERIA UA: NONE SEEN
Bilirubin Urine: NEGATIVE
GLUCOSE, UA: NEGATIVE mg/dL
Hgb urine dipstick: NEGATIVE
Leukocytes, UA: NEGATIVE
NITRITE: NEGATIVE
PROTEIN: 30 mg/dL — AB
RBC / HPF: NONE SEEN RBC/hpf (ref 0–5)
Specific Gravity, Urine: 1.031 — ABNORMAL HIGH (ref 1.005–1.030)
pH: 5 (ref 5.0–8.0)

## 2015-07-11 MED ORDER — QUETIAPINE FUMARATE 100 MG PO TABS
100.0000 mg | ORAL_TABLET | Freq: Every day | ORAL | Status: DC
  Administered 2015-07-11: 100 mg via ORAL
  Filled 2015-07-11: qty 1

## 2015-07-11 MED ORDER — MAGNESIUM HYDROXIDE 400 MG/5ML PO SUSP: 30.0000 mL | Freq: Every day | ORAL | Status: DC | PRN

## 2015-07-11 MED ORDER — BUPRENORPHINE HCL 2 MG SL SUBL: 4.0000 mg | SUBLINGUAL_TABLET | Freq: Every day | SUBLINGUAL | Status: DC

## 2015-07-11 MED ORDER — IBUPROFEN 800 MG PO TABS
ORAL_TABLET | ORAL | Status: AC
  Administered 2015-07-11: 800 mg via ORAL
  Filled 2015-07-11: qty 1

## 2015-07-11 MED ORDER — IBUPROFEN 800 MG PO TABS
800.0000 mg | ORAL_TABLET | Freq: Once | ORAL | Status: AC
  Administered 2015-07-11: 800 mg via ORAL

## 2015-07-11 MED ORDER — NICOTINE 21 MG/24HR TD PT24
21.0000 mg | MEDICATED_PATCH | Freq: Every day | TRANSDERMAL | Status: DC
  Administered 2015-07-12: 21 mg via TRANSDERMAL
  Filled 2015-07-11: qty 1

## 2015-07-11 MED ORDER — ALUM & MAG HYDROXIDE-SIMETH 200-200-20 MG/5ML PO SUSP: 30.0000 mL | ORAL | Status: DC | PRN

## 2015-07-11 MED ORDER — ACETAMINOPHEN 325 MG PO TABS: 650.0000 mg | ORAL_TABLET | Freq: Four times a day (QID) | ORAL | Status: DC | PRN

## 2015-07-11 MED ORDER — BUPRENORPHINE HCL 2 MG SL SUBL
8.0000 mg | SUBLINGUAL_TABLET | Freq: Every day | SUBLINGUAL | Status: DC
  Administered 2015-07-11 – 2015-07-12 (×2): 8 mg via SUBLINGUAL
  Filled 2015-07-11 (×2): qty 4

## 2015-07-11 MED ORDER — PRAZOSIN HCL 1 MG PO CAPS
2.0000 mg | ORAL_CAPSULE | Freq: Two times a day (BID) | ORAL | Status: DC
  Administered 2015-07-12: 2 mg via ORAL
  Filled 2015-07-11: qty 2

## 2015-07-11 MED ORDER — TRAZODONE HCL 100 MG PO TABS: 100.0000 mg | ORAL_TABLET | Freq: Every evening | ORAL | Status: DC | PRN

## 2015-07-11 MED ORDER — FLUVOXAMINE MALEATE 50 MG PO TABS
50.0000 mg | ORAL_TABLET | Freq: Every day | ORAL | Status: DC
  Administered 2015-07-11: 50 mg via ORAL
  Filled 2015-07-11: qty 1

## 2015-07-11 NOTE — Plan of Care (Signed)
Problem: Alteration in mood & ability to function due to Goal: STG-Patient will comply with prescribed medication regimen (Patient will comply with prescribed medication regimen)  Outcome: Progressing Patient has been compliant with medications during shift.

## 2015-07-11 NOTE — ED Notes (Signed)
Patient awake, alert, and oriented. She denies SI. Patient is experiencing some s/s withdrawal, primarily generalized aching.  Patient has been cooperative with all nursing interventions.  Maintained on 15 minute checks and observation by security camera for safety.

## 2015-07-11 NOTE — Progress Notes (Signed)
D: Patient affect is anxious but she denies anxiety. She stated why she was here and that she does fell like she's ready to go home. She stated she has a good support system at home. She denies SI/HI/AVH. She also denies pain.  A: Medication was given with education. Encouragement was provided.  R: Patient was compliant with medication. She has remained calm and cooperative. Safety maintained with 15 min checks.

## 2015-07-11 NOTE — ED Provider Notes (Signed)
Patient evaluated by Dr. Garnetta Buddy psychiatrist a plan for hospital admission  Darci Current, MD 07/11/15 1145

## 2015-07-11 NOTE — BH Assessment (Signed)
Spoke with [facility] Attending Physician/Psychiatrist, Dr. Jennet Maduro. She will put admission orders in for the patient.  Writer informed ER MD (Dr. Manson Passey) and Patient's Nurse (Amy H).

## 2015-07-11 NOTE — ED Notes (Signed)
BEHAVIORAL HEALTH ROUNDING Patient sleeping: Yes.   Patient alert and oriented: not applicable Behavior appropriate: Yes.  ; If no, describe:  Nutrition and fluids offered: No Toileting and hygiene offered: No Sitter present: not applicable Law enforcement present: Yes  

## 2015-07-11 NOTE — Progress Notes (Signed)
Recreation Therapy Notes  Date: 02.20.17 Time: 3:00 pm Location: Community Room  Group Topic: Self-expression  Goal Area(s) Addresses:  Patient will be able to identify a color that represents each emotion. Patient will verbalize benefit of using art as a means of self-expression. Patient will verbalize one positive emotion experienced while participating in activity.  Behavioral Response: Did not attend  Intervention: The Colors Within Me  Activity: Patients were given a blank face worksheet and instructed to pick a color for each emotion they were experiencing and show on the worksheet how much of that emotion they were experiencing.  Education: LRT educated patients on different forms of self-expression.  Education Outcome: Patient did not attend group.   Clinical Observations/Feedback: Patient did not attend group.  Jacquelynn Cree, LRT/CTRS 07/11/2015 4:12 PM

## 2015-07-11 NOTE — ED Notes (Signed)
BEHAVIORAL HEALTH ROUNDING Patient sleeping: Yes.   Patient alert and oriented: no Behavior appropriate: Yes.  ; If no, describe:  Nutrition and fluids offered: No Toileting and hygiene offered: No Sitter present: not applicable Law enforcement present: Yes  

## 2015-07-11 NOTE — ED Notes (Signed)
Patient asleep in room. No noted distress or abnormal behavior. Will continue 15 minute checks and observation by security cameras for safety. 

## 2015-07-11 NOTE — ED Notes (Signed)
Patient experiencing increased anxiety, related both to drug withdrawal and continued stay in the ED. She wants to talk to her boyfriend but has not been able to get through by phone. Minimal response to reassurance.  Will continue to monitor clinical status and maintain on all safety precautions.

## 2015-07-11 NOTE — [facility] Group Notes (Signed)
[facility] LCSW Group Therapy  07/11/2015 2:00 PM  Type of Therapy:  Group Therapy  Participation Level:  Did Not Attend  Summary of Progress/Problems: Patient was called to group but did not attend.  Arnelle Nale T, MSW, LCSWA 07/11/2015, 2:00 PM  

## 2015-07-11 NOTE — BHH Suicide Risk Assessment (Signed)
[facility] INPATIENT:  Family/Significant Other Suicide Prevention Education  Suicide Prevention Education:  Patient Refusal for Family/Significant Other Suicide Prevention Education: The patient Teresa Hunt has refused to provide written consent for family/significant other to be provided Family/Significant Other Suicide Prevention Education during admission and/or prior to discharge.  Physician notified.  CSW completed SPE with the pt.  Dorothe Pea Fatumata Kashani 07/11/2015, 3:43 PM

## 2015-07-11 NOTE — ED Notes (Signed)
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
## 2015-07-11 NOTE — Tx Team (Signed)
Initial Interdisciplinary Treatment Plan   PATIENT STRESSORS: Financial difficulties Substance abuse   PATIENT STRENGTHS: Barrister's clerk for treatment/growth Supportive family/friends   PROBLEM LIST: Problem List/Patient Goals Date to be addressed Date deferred Reason deferred Estimated date of resolution  Depression 07/11/2015     Substance abuse 07/11/2015                                                DISCHARGE CRITERIA:  Ability to meet basic life and health needs Adequate post-discharge living arrangements  PRELIMINARY DISCHARGE PLAN: Attend aftercare/continuing care group Return to previous living arrangement  PATIENT/FAMIILY INVOLVEMENT: This treatment plan has been presented to and reviewed with the patient, Teresa Hunt, and/or family member, .  The patient and family have been given the opportunity to ask questions and make suggestions.  [name] [name] 07/11/2015, 3:17 PM

## 2015-07-11 NOTE — BH Assessment (Signed)
Patient is to be admitted to [facility] by Dr. Jennet Maduro.  Attending Physician will be Dr. Jennet Maduro.   Patient has been assigned to room 303-A, by [facility] Charge Nurse [place].   Intake Paper Work has been signed and placed on patient chart.  ER staff is aware of the admission Misty Stanley, ER Sect.; Dr. Dr. Manson Passey, ER MD; Amy H. Patient's Nurse & Byrd Hesselbach, Patient Access).
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
## 2015-07-11 NOTE — ED Notes (Signed)
Patient able to make phone call to boyfriend which helped alleviate anxiety.  Plan is for inpatient admission.

## 2015-07-11 NOTE — [facility] Suicide Risk Assessment (Signed)
[facility] Admission Suicide Risk Assessment   Nursing information obtained from:  Patient Demographic factors:  Adolescent or young adult, Caucasian, Unemployed Current Mental Status:  NA Loss Factors:  Financial problems / change in socioeconomic status Historical Factors:  Family history of suicide Risk Reduction Factors:  Responsible for children under 29 years of age, Living with another person, especially a relative  Total Time spent with patient: 1 hour Principal Problem: Bipolar I disorder, most recent episode depressed, severe without psychotic features (HCC) Diagnosis:   Patient Active Problem List   Diagnosis Date Noted  . Cannabis use disorder, moderate, dependence (HCC) [F12.20] 07/11/2015  . Sedative, hypnotic or anxiolytic use disorder, severe, dependence (HCC) [F13.20] 07/11/2015  . Cocaine use disorder, moderate, dependence (HCC) [F14.20] 07/11/2015  . Opioid use disorder, moderate, in controlled environment, dependence (HCC) [F11.20] 07/11/2015  . Tobacco use disorder [F17.200] 07/11/2015  . Bipolar I disorder, most recent episode depressed, severe without psychotic features (HCC) [F31.4] 07/11/2015   Subjective Data: Depression, anxiety, substance use.  Continued Clinical Symptoms:  Alcohol Use Disorder Identification Test Final Score (AUDIT): 0 The "Alcohol Use Disorders Identification Test", Guidelines for Use in Primary Care, Second Edition.  World Science writer [facility]). Score between 0-7:  no or low risk or alcohol related problems. Score between 8-15:  moderate risk of alcohol related problems. Score between 16-19:  high risk of alcohol related problems. Score 20 or above:  warrants further diagnostic evaluation for alcohol dependence and treatment.   CLINICAL FACTORS:   Severe Anxiety and/or Agitation Bipolar Disorder:   Depressive phase Depression:   Comorbid alcohol abuse/dependence Impulsivity Insomnia Alcohol/Substance  Abuse/Dependencies Obsessive-Compulsive Disorder   Musculoskeletal: Strength & Muscle Tone: within normal limits Gait & Station: normal Patient leans: N/A  Psychiatric Specialty Exam: Review of Systems  Psychiatric/Behavioral: Positive for depression and substance abuse. The patient is nervous/anxious and has insomnia.   All other systems reviewed and are negative.   Blood pressure 122/66, pulse 68, temperature 97.8 F (36.6 C), temperature source Oral, resp. rate 18, height  (1.6 m), weight 45.813 kg (101 lb), last menstrual period 07/10/2015, SpO2 99 %.Body mass index is 17.9 kg/(m^2).  General Appearance: Casual  Eye Contact::  Good  Speech:  Clear and Coherent  Volume:  Normal  Mood:  Anxious  Affect:  Tearful  Thought Process:  Goal Directed  Orientation:  Full (Time, Place, and Person)  Thought Content:  WDL  Suicidal Thoughts:  No  Homicidal Thoughts:  No  Memory:  Immediate;   Fair Recent;   Fair Remote;   Fair  Judgement:  Impaired  Insight:  Fair  Psychomotor Activity:  Normal  Concentration:  Fair  Recall:  [employer] of Knowledge:Fair  Language: Fair  Akathisia:  No  Handed:  Right  AIMS (if indicated):     Assets:  Communication Skills Desire for Improvement Financial Resources/Insurance Housing Physical Health Resilience Social Support  Sleep:     Cognition: WNL  ADL's:  Intact    COGNITIVE FEATURES THAT CONTRIBUTE TO RISK:  None    SUICIDE RISK:   Moderate:  Frequent suicidal ideation with limited intensity, and duration, some specificity in terms of plans, no associated intent, good self-control, limited dysphoria/symptomatology, some risk factors present, and identifiable protective factors, including available and accessible social support.  PLAN OF CARE: Hospital admission, medication management, substance abuse counseling, discharge planning.  Mrs. Lollis is a 30 year old female with a history of depression, severe anxiety, mood  instability, and substance admitted  after suicide attempt by overdose in the context of substance use.  1. Suicidal ideation. The patient denies any thoughts intention or plans to hurt for herself or others.  2. Mood. We will increase Seroquel 150- 200 mg for mood stabilization.  3. Anxiety. She reports social anxiety, panic attacks. He is the end of OCD. We'll substitute Zoloft with Luvox. We'll start Minipress for nightmares and flashbacks.  4. Smoking. Nicotine patch is available.  5. Opiate dependence. We will continue Suboxone as prescribed by [facility].  6. Substance abuse. She denies alcohol use. She uses Xanax is infrequently. She does not need alcohol detox.  7. Substance abuse treatment. At the patient is not interested at the moment and declines residential treatment.  8. Disposition. She will be discharged to home with her boyfriend. She will follow up with [facility].   I certify that inpatient services furnished can reasonably be expected to improve the patient's condition.   Kristine Linea, MD 07/11/2015, 4:23 PM

## 2015-07-11 NOTE — Progress Notes (Signed)
30 yrs old admitted depression & substance abuse.Body search & skin assessment done.No contraband found.Denies suicidal ideations.States "I did not want to die."She was crying but cooperative on assessment.Oriented patient to unit.

## 2015-07-11 NOTE — ED Notes (Signed)
Patient visiting with mother.  She will be transferred to [facility] inpatient unit for further treatment.

## 2015-07-12 LAB — HEMOGLOBIN A1C: HEMOGLOBIN A1C: 4.3 % (ref 4.0–6.0)

## 2015-07-12 LAB — PROLACTIN: PROLACTIN: 5.3 ng/mL (ref 4.8–23.3)

## 2015-07-12 MED ORDER — FLUVOXAMINE MALEATE 100 MG PO TABS: 100.0000 mg | ORAL_TABLET | Freq: Every day | ORAL | Status: DC

## 2015-07-12 MED ORDER — QUETIAPINE FUMARATE 200 MG PO TABS: 200.0000 mg | ORAL_TABLET | Freq: Every day | ORAL | Status: DC

## 2015-07-12 MED ORDER — PRAZOSIN HCL 2 MG PO CAPS: 2.0000 mg | ORAL_CAPSULE | Freq: Two times a day (BID) | ORAL | Status: DC

## 2015-07-12 MED ORDER — FLUVOXAMINE MALEATE 50 MG PO TABS: 100.0000 mg | ORAL_TABLET | Freq: Every day | ORAL | Status: DC
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
## 2015-07-12 NOTE — Progress Notes (Signed)
  [facility] Adult Case Management Discharge Plan :  Will you be returning to the same living situation after discharge:  Yes,  pt will be returning home to [place], [place] live with her boyfriend and sons. At discharge, do you have transportation home?: Yes,  pt will be picked up by her boyfriend Do you have the ability to pay for your medications: Yes,  pt will be provided with medications at discharge  Release of information consent forms completed and in the chart;  Patient's signature needed at discharge.  Patient to Follow up at: Follow-up Information    Follow up with [facility].   Why:  Please arrive for your hospital follow up with Tiffany at 2pm on Wednesday, February 22nd with your hospital discharge paperwork. Please call [phone] to reschedule if necessary.   Contact information:   [address] [place], [place] [address] Phone: [phone] Fax: [phone]      Next level of care provider has access to [facility] Link:no  Safety Planning and Suicide Prevention discussed: Yes,  completed with pt and the pt's boyfriend  Have you used any form of tobacco in the last 30 days? (Cigarettes, Smokeless Tobacco, Cigars, and/or Pipes): Yes  Has patient been referred to the Quitline?: Patient refused referral  Patient has been referred for addiction treatment: Yes  Dorothe Pea Elon Eoff 07/12/2015, 9:51 AM

## 2015-07-12 NOTE — [facility] Suicide Risk Assessment (Signed)
[facility] INPATIENT:  Family/Significant Other Suicide Prevention Education  Suicide Prevention Education:  Education Completed; Scherrie Merritts at ph: [phone],  (name of family member/significant other) has been identified by the patient as the family member/significant other with whom the patient will be residing, and identified as the person(s) who will aid the patient in the event of a mental health crisis (suicidal ideations/suicide attempt).  With written consent from the patient, the family member/significant other has been provided the following suicide prevention education, prior to the and/or following the discharge of the patient.  CSW completed SPE with pt.  The suicide prevention education provided includes the following:  Suicide risk factors  Suicide prevention and interventions  National Suicide Hotline telephone number  [facility] assessment telephone number  [facility] Emergency Assistance 911  [facility] and/or Residential Mobile Crisis Unit telephone number  Request made of family/significant other to:  Remove weapons (e.g., guns, rifles, knives), all items previously/currently identified as safety concern.    Remove drugs/medications (over-the-counter, prescriptions, illicit drugs), all items previously/currently identified as a safety concern.  The family member/significant other verbalizes understanding of the suicide prevention education information provided.  The family member/significant other agrees to remove the items of safety concern listed above.  Teresa Hunt Teresa Hunt 07/12/2015, 9:50 AM

## 2015-07-12 NOTE — Progress Notes (Signed)
Pleasant and cooperative.  Denies SI/HI/AVH.  Verbalizes that she made a stupid mistake because she was mad at her boyfriend.  Further stated that from now on will take a step back and cool off. Discharge instructions given, verbalized understanding.  Prescriptions given and personal belongings returned.  Escorted off unit by this Clinical research associate to meet boyfriend to travel home.
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
## 2015-07-12 NOTE — [facility] Suicide Risk Assessment (Signed)
[facility] Discharge Suicide Risk Assessment   Principal Problem: Bipolar I disorder, most recent episode depressed, severe without psychotic features [facility]) Discharge Diagnoses:  Patient Active Problem List   Diagnosis Date Noted  . Cannabis use disorder, moderate, dependence (HCC) [F12.20] 07/11/2015  . 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 Cocaine use disorder, moderate, dependence (HCC) [F14.20] 07/11/2015  . Opioid use disorder, moderate, in controlled environment, dependence (HCC) [F11.20] 07/11/2015  . Tobacco use disorder [F17.200] 07/11/2015  . Bipolar I disorder, most recent episode depressed, severe without psychotic features (HCC) [F31.4] 07/11/2015    Total Time spent with patient: 30 minutes  Musculoskeletal: Strength & Muscle Tone: within normal limits Gait & Station: normal Patient leans: N/A  Psychiatric Specialty Exam: Review of Systems  All other systems reviewed and are negative.   Blood pressure 122/66, pulse 68, temperature 97.8 F (36.6 C), temperature source Oral, resp. rate 18, height  (1.6 m), weight 45.813 kg (101 lb), last menstrual period 07/10/2015, SpO2 99 %.Body mass index is 17.9 kg/(m^2).  General Appearance: Casual  Eye Contact::  Good  Speech:  Clear and Coherent409  Volume:  Normal  Mood:  Euthymic  Affect:  Appropriate  Thought Process:  Goal Directed  Orientation:  Full (Time, Place, and Person)  Thought Content:  WDL  Suicidal Thoughts:  No  Homicidal Thoughts:  No  Memory:  Immediate;   Fair Recent;   Fair Remote;   Fair  Judgement:  Impaired  Insight:  Present  Psychomotor Activity:  Normal  Concentration:  Fair  Recall:  [employer] of Knowledge:Fair  Language: Fair  Akathisia:  No  Handed:  Right  AIMS (if indicated):     Assets:  Communication Skills Desire for Improvement Financial Resources/Insurance Housing Intimacy Physical Health Resilience Social Support  Sleep:   Number of Hours: 7  Cognition: WNL  ADL's:  Intact   Mental Status Per Nursing Assessment::   On Admission:  NA  Demographic Factors:  Adolescent or young adult, Caucasian and Unemployed  Loss Factors: NA  Historical Factors: Family history of mental illness or substance abuse and Impulsivity  Risk Reduction Factors:   Responsible for children under 39 years of age, Sense of responsibility to family, Living with another person, especially a relative, Positive social support and Positive therapeutic relationship  Continued Clinical Symptoms:  Bipolar Disorder:   Depressive phase Alcohol/Substance Abuse/Dependencies Obsessive-Compulsive Disorder More than one psychiatric diagnosis Previous Psychiatric Diagnoses and Treatments  Cognitive Features That Contribute To Risk:  None    Suicide Risk:  Minimal: No identifiable suicidal ideation.  Patients presenting with no risk factors but with morbid ruminations; may be classified as minimal risk based on the severity of the depressive symptoms  Follow-up Information    Follow up with [facility].   Why:  Please arrive to the walk-in clinic between the hours of 8am-2:30pm.  Thad Ranger as early as possible for prompt service.    Contact information:   [address] [place], [place] [address] Phone: [phone] Fax: [phone]      Plan Of Care/Follow-up recommendations:  Activity:  As tolerated. Diet:  Regular. Other:  Keep follow-up appointments.  Kristine Linea, MD 07/12/2015, 9:30 AM

## 2015-08-04 ENCOUNTER — Encounter: Payer: Self-pay | Admitting: Emergency Medicine

## 2015-08-04 ENCOUNTER — Emergency Department
Admission: EM | Admit: 2015-08-04 | Discharge: 2015-08-04 | Disposition: A | Discharge status: Home / Self Care | Payer: Medicaid Other | Attending: Emergency Medicine | Admitting: Emergency Medicine

## 2015-08-04 DIAGNOSIS — F1110 Opioid abuse, uncomplicated: Secondary | ICD-10-CM | POA: Insufficient documentation

## 2015-08-04 DIAGNOSIS — R42 Dizziness and giddiness: Secondary | ICD-10-CM | POA: Insufficient documentation

## 2015-08-04 DIAGNOSIS — F17210 Nicotine dependence, cigarettes, uncomplicated: Secondary | ICD-10-CM | POA: Insufficient documentation

## 2015-08-04 HISTORY — DX: Bipolar disorder, current episode depressed, mild or moderate severity, unspecified: F31.30

## 2015-08-04 HISTORY — DX: Other psychoactive substance abuse, uncomplicated: F19.10

## 2015-08-04 NOTE — ED Notes (Signed)
Patient states "I am a heroin addict and am [facility]  [facility] and [facility]  [facility] then recommended to come to ED to have a second referral done and [facility].  Patient's father [facility] that [facility] and [facility] is currently on the [facility].    Denies SI/ HI.  Patient does have history of intentional overdose and was diagnosed with Bipolar.

## 2015-08-06 ENCOUNTER — Emergency Department
Admission: EM | Admit: 2015-08-06 | Discharge: 2015-08-07 | Disposition: A | Discharge status: Other Acute Inpatient Hospital | Payer: Medicaid Other | Attending: Emergency Medicine | Admitting: Emergency Medicine

## 2015-08-06 ENCOUNTER — Encounter: Payer: Self-pay | Admitting: Emergency Medicine

## 2015-08-06 DIAGNOSIS — F1219 Cannabis abuse with unspecified cannabis-induced disorder: Secondary | ICD-10-CM | POA: Insufficient documentation

## 2015-08-06 DIAGNOSIS — F1419 Cocaine abuse with unspecified cocaine-induced disorder: Secondary | ICD-10-CM | POA: Insufficient documentation

## 2015-08-06 DIAGNOSIS — F17210 Nicotine dependence, cigarettes, uncomplicated: Secondary | ICD-10-CM | POA: Diagnosis not present

## 2015-08-06 DIAGNOSIS — F17200 Nicotine dependence, unspecified, uncomplicated: Secondary | ICD-10-CM | POA: Diagnosis present

## 2015-08-06 DIAGNOSIS — T1491 Suicide attempt: Secondary | ICD-10-CM | POA: Diagnosis present

## 2015-08-06 DIAGNOSIS — F1310 Sedative, hypnotic or anxiolytic abuse, uncomplicated: Secondary | ICD-10-CM | POA: Diagnosis present

## 2015-08-06 DIAGNOSIS — F1910 Other psychoactive substance abuse, uncomplicated: Secondary | ICD-10-CM | POA: Diagnosis not present

## 2015-08-06 DIAGNOSIS — J45909 Unspecified asthma, uncomplicated: Secondary | ICD-10-CM | POA: Insufficient documentation

## 2015-08-06 DIAGNOSIS — F1099 Alcohol use, unspecified with unspecified alcohol-induced disorder: Secondary | ICD-10-CM | POA: Diagnosis not present

## 2015-08-06 DIAGNOSIS — F331 Major depressive disorder, recurrent, moderate: Secondary | ICD-10-CM | POA: Diagnosis not present

## 2015-08-06 DIAGNOSIS — F314 Bipolar disorder, current episode depressed, severe, without psychotic features: Secondary | ICD-10-CM | POA: Diagnosis not present

## 2015-08-06 DIAGNOSIS — F1120 Opioid dependence, uncomplicated: Secondary | ICD-10-CM | POA: Diagnosis present

## 2015-08-06 DIAGNOSIS — F1220 Cannabis dependence, uncomplicated: Secondary | ICD-10-CM | POA: Diagnosis present

## 2015-08-06 DIAGNOSIS — F603 Borderline personality disorder: Secondary | ICD-10-CM

## 2015-08-06 DIAGNOSIS — F339 Major depressive disorder, recurrent, unspecified: Secondary | ICD-10-CM

## 2015-08-06 DIAGNOSIS — F1420 Cocaine dependence, uncomplicated: Secondary | ICD-10-CM | POA: Diagnosis present

## 2015-08-06 HISTORY — DX: Unspecified asthma, uncomplicated: J45.909

## 2015-08-06 HISTORY — DX: Cardiac murmur, unspecified: R01.1

## 2015-08-06 LAB — ACETAMINOPHEN LEVEL: Acetaminophen (Tylenol), Serum: <10 ug/mL — ABNORMAL LOW (ref 10–30)

## 2015-08-06 LAB — SALICYLATE LEVEL

## 2015-08-06 LAB — URINE DRUG SCREEN, QUALITATIVE (ARMC ONLY)
Amphetamines, Ur Screen: NOT DETECTED
BARBITURATES, UR SCREEN: NOT DETECTED
BENZODIAZEPINE, UR SCRN: POSITIVE — AB
CANNABINOID 50 NG, UR ~~LOC~~: POSITIVE — AB
Cocaine Metabolite,Ur ~~LOC~~: POSITIVE — AB
MDMA (Ecstasy)Ur Screen: NOT DETECTED
METHADONE SCREEN, URINE: NOT DETECTED
OPIATE, UR SCREEN: NOT DETECTED
PHENCYCLIDINE (PCP) UR S: NOT DETECTED
Tricyclic, Ur Screen: NOT DETECTED

## 2015-08-06 LAB — CBC
HCT: 37.9 % (ref 35.0–47.0)
Hemoglobin: 12.8 g/dL (ref 12.0–16.0)
MCH: 30.6 pg (ref 26.0–34.0)
MCHC: 33.8 g/dL (ref 32.0–36.0)
MCV: 90.5 fL (ref 80.0–100.0)
PLATELETS: 212 10*3/uL (ref 150–440)
RBC: 4.19 MIL/uL (ref 3.80–5.20)
RDW: 13.5 % (ref 11.5–14.5)
WBC: 7.2 10*3/uL (ref 3.6–11.0)

## 2015-08-06 LAB — BASIC METABOLIC PANEL
Anion gap: 2 — ABNORMAL LOW (ref 5–15)
BUN: 15 mg/dL (ref 6–20)
CALCIUM: 8.7 mg/dL — AB (ref 8.9–10.3)
CHLORIDE: 108 mmol/L (ref 101–111)
CO2: 29 mmol/L (ref 22–32)
CREATININE: 0.73 mg/dL (ref 0.44–1.00)
GFR calc Af Amer: >60 mL/min (ref >60)
GFR calc non Af Amer: >60 mL/min (ref >60)
GLUCOSE: 100 mg/dL — AB (ref 65–99)
Potassium: 3.3 mmol/L — ABNORMAL LOW (ref 3.5–5.1)
Sodium: 139 mmol/L (ref 135–145)

## 2015-08-06 LAB — ETHANOL: Alcohol, Ethyl (B): <5 mg/dL (ref <5)

## 2015-08-06 LAB — POCT PREGNANCY, URINE: Preg Test, Ur: NEGATIVE

## 2015-08-06 NOTE — ED Notes (Signed)
[facility].  Per paperwork patient attempted to hang herself today with a scarf.  Patient states, "My mom said I was suicidal because she was mad that I shot up last night."  Patient reports using a large amount of cocaine the past 2 days and drinking a (1/2) of a 1/5 of liquor today.  Patient states after her mother called the police patient was upset so she started banging her head on the wall of her mother's house until the sheet rock broke.  Patient is now complaining of a headache.  Patient is calm and compliant at this time.

## 2015-08-06 NOTE — ED Provider Notes (Signed)
[facility] Emergency Department Provider Note  ____________________________________________  Time seen: Approximately 10:10 PM  I have reviewed the triage vital signs and the nursing notes.   HISTORY  Chief Complaint Psychiatric Evaluation    HPI Teresa Hunt is a 30 y.o. female presents for evaluation under involuntary commitment.  The patient reports that she used cocaine as well as alcohol tonight as she is nervous about having to go to detox on Monday in [place].  She states that she is not suicidal, but she reports that her mother told the police she was trying to hang herself in order to use her to come here. Involuntary commitment paper as noted to state patient may be suicidal.  She denies any recent illness. She reports she doesn't history of substance abuse primarily heroin. She does not use alcohol regularly but did use tonight because of anxiety. She reports she has a long history of heroin use and is used up all of her veins.  She states she feels a little anxious and agitated right now. No fevers chills nausea vomiting. Denies overdose or intent to harm self.   Past Medical History  Diagnosis Date  . UTI (lower urinary tract infection)   . Drug abuse   . Bipolar affect, depressed (HCC)   . Asthma   . Heart murmur     Patient Active Problem List   Diagnosis Date Noted  . Cannabis use disorder, moderate, dependence (HCC) 07/11/2015  . Sedative, hypnotic or anxiolytic use disorder, mild, abuse 07/11/2015  . Cocaine use disorder, moderate, dependence (HCC) 07/11/2015  . Opioid use disorder, moderate, in controlled environment, dependence (HCC) 07/11/2015  . Tobacco use disorder 07/11/2015  . Bipolar I disorder, most recent episode depressed, severe without psychotic features (HCC) 07/11/2015    Past Surgical History  Procedure Laterality Date  . Cesarean section    . Exploratory laparotomy      Current Outpatient Rx  Name   Route  Sig  Dispense  Refill  . Buprenorphine HCl-Naloxone HCl (SUBOXONE) 8-2 MG FILM   Sublingual   Place 2 Film under the tongue daily.          . fluvoxaMINE (LUVOX) 100 MG tablet   Oral   Take 1 tablet (100 mg total) by mouth at bedtime.   30 tablet   1   . prazosin (MINIPRESS) 2 MG capsule   Oral   Take 1 capsule (2 mg total) by mouth 2 (two) times daily.   60 capsule   1   . QUEtiapine (SEROQUEL) 200 MG tablet   Oral   Take 1 tablet (200 mg total) by mouth at bedtime.   30 tablet   1     Allergies Review of patient's allergies indicates no known allergies.  History reviewed. No pertinent family history.  Social History Social History  Substance Use Topics  . Smoking status: Current Every Day Smoker -- 1.00 packs/day    Types: Cigarettes  . Smokeless tobacco: None  . Alcohol Use: Yes     Comment: sometimes    Review of Systems Constitutional: No fever/chills Eyes: No visual changes. ENT: No sore throat. Cardiovascular: Denies chest pain. Respiratory: Denies shortness of breath. Gastrointestinal: No abdominal pain.  No nausea, no vomiting.  No diarrhea.  No constipation. Genitourinary: Negative for dysuria. Musculoskeletal: Negative for back pain. Skin: Negative for rash. Neurological: Negative for headaches, focal weakness or numbness.  10-point ROS otherwise negative.  ____________________________________________   PHYSICAL EXAM:  VITAL  SIGNS: ED Triage Vitals  Enc Vitals Group     BP 08/06/15 1939 125/106 mmHg     Pulse Rate 08/06/15 1939 91     Resp 08/06/15 1939 20     Temp 08/06/15 1939 97.9 F (36.6 C)     Temp Source 08/06/15 1939 Oral     SpO2 08/06/15 1939 99 %     Weight 08/06/15 1939 115 lb (52.164 kg)     Height 08/06/15 1939  (1.626 m)     Head Cir --      Peak Flow --      Pain Score 08/06/15 1939 5     Pain Loc --      Pain Edu? --      Excl. in GC? --    Constitutional: Alert and oriented. Well appearing and in  no acute distress. Eyes: Conjunctivae are normal. PERRL. EOMI. Head: Atraumatic. Nose: No congestion/rhinnorhea. Mouth/Throat: Mucous membranes are moist.  Oropharynx non-erythematous. Neck: No stridor.   Cardiovascular: Normal rate, regular rhythm. Grossly normal heart sounds.  Good peripheral circulation. Respiratory: Normal respiratory effort.  No retractions. Lungs CTAB. Gastrointestinal: Soft and nontender.  Musculoskeletal: No lower extremity tenderness nor edema.  No joint effusions. Neurologic:  Normal speech and language. No gross focal neurologic deficits are appreciated.Skin:  Skin is warm, dry and intact. No rash noted. Multiple track marks noted. No overlying erythema or abscess noted. Psychiatric: Mood and affect are slightly agitated. Speech and behavior are normal.  ____________________________________________   LABS (all labs ordered are listed, but only abnormal results are displayed)  Labs Reviewed  ACETAMINOPHEN LEVEL - Abnormal; Notable for the following:    Acetaminophen (Tylenol), Serum <10 (*)    All other components within normal limits  CBC  SALICYLATE LEVEL  URINE DRUG SCREEN, QUALITATIVE ([facility] ONLY)  BASIC METABOLIC PANEL  POCT PREGNANCY, URINE   ____________________________________________  EKG   ____________________________________________  RADIOLOGY   ____________________________________________   PROCEDURES  Procedure(s) performed: None  Critical Care performed: No  ____________________________________________   INITIAL IMPRESSION / ASSESSMENT AND PLAN / ED COURSE  Pertinent labs & imaging results that were available during my care of the patient were reviewed by me and considered in my medical decision making (see chart for details).  We'll maintain patient on involuntary commitment based off reports of possible suicidality, though the patient does deny this to me at present. She does report using cocaine and alcohol the night.  She is presently in no distress, just slightly agitated. I have placed a consult to psychiatry.  Ongoing care and disposition assigned Dr. Alphonzo Lemmings. Follow-up on psychiatry recommendations. Patient on involuntary commitment. Also following up on basic metabolic panel. ____________________________________________   FINAL CLINICAL IMPRESSION(S) / ED DIAGNOSES  Final diagnoses:  Polysubstance abuse      Sharyn Creamer, MD 08/06/15 2337

## 2015-08-07 ENCOUNTER — Inpatient Hospital Stay
Admission: EM | Admit: 2015-08-07 | Discharge: 2015-08-12 | DRG: 885 | Disposition: A | Discharge status: Home / Self Care | Payer: Medicaid Other | Source: Intra-hospital | Attending: Psychiatry | Admitting: Psychiatry

## 2015-08-07 ENCOUNTER — Encounter: Payer: Self-pay | Admitting: *Deleted

## 2015-08-07 DIAGNOSIS — F4310 Post-traumatic stress disorder, unspecified: Secondary | ICD-10-CM | POA: Diagnosis present

## 2015-08-07 DIAGNOSIS — F1220 Cannabis dependence, uncomplicated: Secondary | ICD-10-CM | POA: Diagnosis present

## 2015-08-07 DIAGNOSIS — F410 Panic disorder [episodic paroxysmal anxiety] without agoraphobia: Secondary | ICD-10-CM | POA: Diagnosis present

## 2015-08-07 DIAGNOSIS — F429 Obsessive-compulsive disorder, unspecified: Secondary | ICD-10-CM | POA: Diagnosis present

## 2015-08-07 DIAGNOSIS — F17210 Nicotine dependence, cigarettes, uncomplicated: Secondary | ICD-10-CM | POA: Diagnosis present

## 2015-08-07 DIAGNOSIS — R4587 Impulsiveness: Secondary | ICD-10-CM | POA: Diagnosis present

## 2015-08-07 DIAGNOSIS — F4010 Social phobia, unspecified: Secondary | ICD-10-CM | POA: Diagnosis present

## 2015-08-07 DIAGNOSIS — F1910 Other psychoactive substance abuse, uncomplicated: Secondary | ICD-10-CM | POA: Diagnosis not present

## 2015-08-07 DIAGNOSIS — F339 Major depressive disorder, recurrent, unspecified: Secondary | ICD-10-CM | POA: Diagnosis present

## 2015-08-07 DIAGNOSIS — J45909 Unspecified asthma, uncomplicated: Secondary | ICD-10-CM | POA: Diagnosis present

## 2015-08-07 DIAGNOSIS — F331 Major depressive disorder, recurrent, moderate: Secondary | ICD-10-CM | POA: Diagnosis not present

## 2015-08-07 DIAGNOSIS — F1310 Sedative, hypnotic or anxiolytic abuse, uncomplicated: Secondary | ICD-10-CM | POA: Diagnosis present

## 2015-08-07 DIAGNOSIS — K0889 Other specified disorders of teeth and supporting structures: Secondary | ICD-10-CM | POA: Diagnosis present

## 2015-08-07 DIAGNOSIS — G4700 Insomnia, unspecified: Secondary | ICD-10-CM | POA: Diagnosis present

## 2015-08-07 DIAGNOSIS — F1123 Opioid dependence with withdrawal: Secondary | ICD-10-CM | POA: Diagnosis present

## 2015-08-07 DIAGNOSIS — F40240 Claustrophobia: Secondary | ICD-10-CM | POA: Diagnosis present

## 2015-08-07 DIAGNOSIS — F17200 Nicotine dependence, unspecified, uncomplicated: Secondary | ICD-10-CM | POA: Diagnosis present

## 2015-08-07 DIAGNOSIS — F603 Borderline personality disorder: Secondary | ICD-10-CM

## 2015-08-07 DIAGNOSIS — F1120 Opioid dependence, uncomplicated: Secondary | ICD-10-CM | POA: Diagnosis present

## 2015-08-07 DIAGNOSIS — F13239 Sedative, hypnotic or anxiolytic dependence with withdrawal, unspecified: Secondary | ICD-10-CM | POA: Diagnosis present

## 2015-08-07 DIAGNOSIS — Z818 Family history of other mental and behavioral disorders: Secondary | ICD-10-CM

## 2015-08-07 DIAGNOSIS — F1420 Cocaine dependence, uncomplicated: Secondary | ICD-10-CM | POA: Diagnosis present

## 2015-08-07 MED ORDER — ACETAMINOPHEN 325 MG PO TABS
650.0000 mg | ORAL_TABLET | Freq: Once | ORAL | Status: AC
  Administered 2015-08-07: 650 mg via ORAL

## 2015-08-07 MED ORDER — NICOTINE 21 MG/24HR TD PT24: 21.0000 mg | MEDICATED_PATCH | Freq: Every day | TRANSDERMAL | Status: DC

## 2015-08-07 MED ORDER — HYDROXYZINE HCL 50 MG PO TABS
50.0000 mg | ORAL_TABLET | Freq: Once | ORAL | Status: AC
  Administered 2015-08-07: 50 mg via ORAL
  Filled 2015-08-07: qty 1

## 2015-08-07 MED ORDER — ACETAMINOPHEN 325 MG PO TABS
ORAL_TABLET | ORAL | Status: AC
  Administered 2015-08-07: 650 mg via ORAL
  Filled 2015-08-07: qty 2

## 2015-08-07 MED ORDER — FLUVOXAMINE MALEATE 50 MG PO TABS
100.0000 mg | ORAL_TABLET | Freq: Every morning | ORAL | Status: DC
Start: 2015-08-08 — End: 2015-08-07

## 2015-08-07 MED ORDER — LORAZEPAM 1 MG PO TABS
ORAL_TABLET | ORAL | Status: AC
  Administered 2015-08-07: 2 mg via ORAL
  Filled 2015-08-07: qty 2

## 2015-08-07 MED ORDER — PRAZOSIN HCL 2 MG PO CAPS
2.0000 mg | ORAL_CAPSULE | Freq: Two times a day (BID) | ORAL | Status: DC
  Administered 2015-08-07: 2 mg via ORAL
  Filled 2015-08-07: qty 1

## 2015-08-07 MED ORDER — HYDROXYZINE HCL 50 MG PO TABS
50.0000 mg | ORAL_TABLET | Freq: Three times a day (TID) | ORAL | Status: DC | PRN
Start: 2015-08-07 — End: 2015-08-12
  Administered 2015-08-07 – 2015-08-12 (×4): 50 mg via ORAL
  Filled 2015-08-07 (×7): qty 1

## 2015-08-07 MED ORDER — HYDROXYZINE HCL 25 MG PO TABS
50.0000 mg | ORAL_TABLET | Freq: Once | ORAL | Status: DC
  Administered 2015-08-07: 50 mg via ORAL
  Filled 2015-08-07: qty 2

## 2015-08-07 MED ORDER — HYDROXYZINE HCL 25 MG PO TABS: 50.0000 mg | ORAL_TABLET | Freq: Three times a day (TID) | ORAL | Status: DC | PRN

## 2015-08-07 MED ORDER — PRAZOSIN HCL 1 MG PO CAPS
2.0000 mg | ORAL_CAPSULE | Freq: Two times a day (BID) | ORAL | Status: DC
  Administered 2015-08-07 – 2015-08-08 (×2): 2 mg via ORAL
  Filled 2015-08-07 (×2): qty 2

## 2015-08-07 MED ORDER — QUETIAPINE FUMARATE 200 MG PO TABS: 200.0000 mg | ORAL_TABLET | Freq: Every day | ORAL | Status: DC

## 2015-08-07 MED ORDER — ALUM & MAG HYDROXIDE-SIMETH 200-200-20 MG/5ML PO SUSP: 30.0000 mL | ORAL | Status: DC | PRN

## 2015-08-07 MED ORDER — QUETIAPINE FUMARATE 200 MG PO TABS
200.0000 mg | ORAL_TABLET | Freq: Every day | ORAL | Status: DC
  Administered 2015-08-07: 200 mg via ORAL
  Filled 2015-08-07: qty 1

## 2015-08-07 MED ORDER — NICOTINE 21 MG/24HR TD PT24
21.0000 mg | MEDICATED_PATCH | Freq: Every day | TRANSDERMAL | Status: DC
  Administered 2015-08-07 – 2015-08-12 (×6): 21 mg via TRANSDERMAL
  Filled 2015-08-07 (×5): qty 1

## 2015-08-07 MED ORDER — ACETAMINOPHEN 325 MG PO TABS
650.0000 mg | ORAL_TABLET | Freq: Four times a day (QID) | ORAL | Status: DC | PRN
  Administered 2015-08-07 – 2015-08-08 (×2): 650 mg via ORAL
  Filled 2015-08-07 (×2): qty 2

## 2015-08-07 MED ORDER — FLUVOXAMINE MALEATE 50 MG PO TABS: 100.0000 mg | ORAL_TABLET | Freq: Every day | ORAL | Status: DC

## 2015-08-07 MED ORDER — MAGNESIUM HYDROXIDE 400 MG/5ML PO SUSP: 30.0000 mL | Freq: Every day | ORAL | Status: DC | PRN

## 2015-08-07 MED ORDER — FLUVOXAMINE MALEATE 50 MG PO TABS
100.0000 mg | ORAL_TABLET | Freq: Every morning | ORAL | Status: DC
  Administered 2015-08-07: 100 mg via ORAL
  Filled 2015-08-07: qty 2

## 2015-08-07 MED ORDER — FLUVOXAMINE MALEATE 50 MG PO TABS
100.0000 mg | ORAL_TABLET | Freq: Every day | ORAL | Status: DC
  Administered 2015-08-07 – 2015-08-11 (×5): 100 mg via ORAL
  Filled 2015-08-07 (×5): qty 2

## 2015-08-07 MED ORDER — LORAZEPAM 2 MG PO TABS
2.0000 mg | ORAL_TABLET | Freq: Once | ORAL | Status: AC
  Administered 2015-08-07: 2 mg via ORAL

## 2015-08-07 NOTE — ED Notes (Signed)
Pt up eating breakfast  

## 2015-08-07 NOTE — ED Notes (Signed)
Patient received lunch tray 

## 2015-08-07 NOTE — ED Notes (Signed)
Patient being admitted downstairs, Dr. And case worker told her and she became upset, starting crying, Dr. Lenna Gilfordrdered ativan 2 mg po for anxiety, Patient took without difficulty, nurse talked with patient. Patient wanted to use the phone, so Patient in dayroom talking on phone, nurse encouraged Patient to stay calm.

## 2015-08-07 NOTE — ED Notes (Signed)
Pt reports not getting her "sleeping medicine and suboxone", pt reports being on suboxone "since last year"

## 2015-08-07 NOTE — ED Notes (Signed)
Patient is talking with psychiatrist and case worker.

## 2015-08-07 NOTE — Progress Notes (Signed)
Teresa Hunt seems anxious on approach, she was medication compliant, she seems to be anxious and wants to know if she will be able to continue her suboxone, I instructed her to follow up with her with doctor . She was visible in the milieu and appears to be in bed resting at this time.
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
## 2015-08-07 NOTE — Progress Notes (Signed)
LCSW met with patient and Dr Jerilee Hoh. Patient gave verbal consent to call her boyfriend Marchia Bond. She provided the number [phone] and we called. He stated they made a referral through [place] to go to [facility] but we were unable to call and substantiate the claim as Tegi from [facility] will not be in until Monday [phone]  Ext [phone]. Patients boyfriend reported he did not feel he could keep her safe and from using drugs until a bed was ready for her. He reported her entire family reports they cant trust her and she will go to extremes to get what she wants including lying and manipulating people. During our interview while patient appeared to be honest she became very emotional when she felt that we were not going to release her.  In consultation with Dr Jerilee Hoh we decided to admit patient to [facility].  No further needs [employer] LCSW [phone]

## 2015-08-07 NOTE — Tx Team (Signed)
Initial Interdisciplinary Treatment Plan   PATIENT STRESSORS: Substance abuse   PATIENT STRENGTHS: Average or above average intelligence Capable of independent living Motivation for treatment/growth   PROBLEM LIST: Problem List/Patient Goals Date to be addressed Date deferred Reason deferred Estimated date of resolution   "develop coping skills"                                                       DISCHARGE CRITERIA:  Improved stabilization in mood, thinking, and/or behavior Withdrawal symptoms are absent or subacute and managed without 24-hour nursing intervention  PRELIMINARY DISCHARGE PLAN: inpatient treatment program  PATIENT/FAMIILY INVOLVEMENT: This treatment plan has been presented to and reviewed with the patient, Teresa Hunt, and/or family member, .  The patient and family have been given the opportunity to ask questions and make suggestions.  Leonia ReaderCobb, Emmanuelle Coxe B 08/07/2015, 6:19 PM

## 2015-08-07 NOTE — ED Notes (Signed)
Patient took prescribed vistaril as ordered.

## 2015-08-07 NOTE — ED Notes (Signed)
Patient had visitor, patient relations Althea CharonLu Ann stood by to monitor. Patient's boyfriend supportive. Patient's boyfriend states that they have a good support system. Nurse went thru belongings with patient and boyfriend did take some items back and left some clean new items,. Patient was teary eyed thru visit, but remained calm.

## 2015-08-07 NOTE — Progress Notes (Signed)
30 year old female IVC'ed for substance abuse and SI. Presents to unit from ED in scrubs.  Patient tearful and blaming her mother for being.  Verbalized "I go to [facility] on Monday and I just wanted to have a last hooray before going in for treatment and my momma got mad and called the police on me"  Reports feeling helpless and worthless.  Also states that she does not like herself.   Body search and skin assessment performed.  No contraband found.  Multiple tattoos noted to entire body.  Track marks noted to both arms.

## 2015-08-07 NOTE — ED Notes (Signed)
Patient is calm, took shower, no s/s of distress at this time, Patient denies Si/HI/ patient with 15 min checks and camera monitoring.

## 2015-08-07 NOTE — ED Notes (Signed)
Nurse called report to ConAgra FoodsPhyllis RN downstairs in [facility]. Patient being transferred to room 24.

## 2015-08-07 NOTE — BH Assessment (Signed)
Assessment Note  Teresa Hunt is an 30 y.o. female presenting to the ED under IVC with report trying to hang herself with a scarf.  Pt denies these allegations.  She states that her mother made this story up because she discovered that pt had been using heroin and cocaine.  Pt reports she has to report to [facility] on Monday and was trying to get high "one last time" before she goes.  She states that she and her mother got into an argument over her drug use.  Pt reports that her mother is a "hypocrite because we use drugs together".  Pt denies any Hi or auditory/visual hallucinations.  Diagnosis: Drug Abuse  Past Medical History:  Past Medical History  Diagnosis Date  . UTI (lower urinary tract infection)   . Drug abuse   . Bipolar affect, depressed (HCC)   . Asthma   . Heart murmur     Past Surgical History  Procedure Laterality Date  . Cesarean section    . Exploratory laparotomy      Family History: History reviewed. No pertinent family history.  Social History:  reports that she has been smoking Cigarettes.  She has been smoking about 1.00 pack per day. She does not have any smokeless tobacco history on file. She reports that she drinks alcohol. She reports that she uses illicit drugs (IV, Cocaine, and Marijuana).  Additional Social History:  Alcohol / Drug Use History of alcohol / drug use?: Yes Substance #1 Name of Substance 1: Heroin 1 - Age of First Use: 26 1 - Amount (size/oz): 1/2/ gram 1 - Frequency: daily 1 - Duration: 3 years 1 - Last Use / Amount: 08/06/15 Substance #2 Name of Substance 2: Cocaine 2 - Age of First Use: 14 2 - Amount (size/oz): 1 gram 2 - Frequency: 4 times a month 2 - Duration: daily 2 - Last Use / Amount: 08/06/15 Substance #3 Name of Substance 3: Xanax 3 - Age of First Use: 20  3 - Amount (size/oz): 30 day supply 3 - Frequency: "as often as I can get them" 3 - Duration: since 20 3 - Last Use / Amount: 08/06/15  CIWA: CIWA-Ar BP:  101/64 mmHg Pulse Rate: 76 COWS:    Allergies: No Known Allergies  Home Medications:  (Not in a hospital admission)  OB/GYN Status:  Patient's last menstrual period was 07/10/2015.  General Assessment Data Location of Assessment: [facility] ED TTS Assessment: In system Is this a Tele or Face-to-Face Assessment?: Face-to-Face Is this an Initial Assessment or a Re-assessment for this encounter?: Initial Assessment Marital status: Divorced [place] name: N/A Is patient pregnant?: No Pregnancy Status: No Living Arrangements: Spouse/significant other (4 years) Can pt return to current living arrangement?: Yes Admission Status: Involuntary Is patient capable of signing voluntary admission?: No Referral Source: Self/Family/Friend Insurance type: Medicaid  Medical Screening Exam [facility] Walk-in ONLY) Medical Exam completed: Yes  Crisis Care Plan Living Arrangements: Spouse/significant other (4 years) Legal Guardian: Other: (self) Name of Psychiatrist: Dr. Sharlene Dory. [place]  Investment banker, operational) Name of Therapist: Passenger transport manager Status Is patient currently in school?: No Current Grade: N/A Highest grade of school patient has completed: GED Name of school: n/a Contact person: n/a  Risk to self with the past 6 months Suicidal Ideation: Yes-Currently Present Has patient been a risk to self within the past 6 months prior to admission? : Yes Suicidal Intent: No Has patient had any suicidal intent within the past 6 months  prior to admission? : Yes Is patient at risk for suicide?: No Suicidal Plan?: No (Pt denies) Has patient had any suicidal plan within the past 6 months prior to admission? : Yes Specify Current Suicidal Plan: Overdose on medications Access to Means: Yes Specify Access to Suicidal Means: Pt has access to drugs and medications What has been your use of drugs/alcohol within the last 12 months?: Cocaine & Heroin Previous  Attempts/Gestures: Yes How many times?: 1 Other Self Harm Risks: History of SIB Triggers for Past Attempts: Other (Comment) (SIB) Intentional Self Injurious Behavior: Bruising, Burning, Damaging, Cutting Comment - Self Injurious Behavior: History of SIB since the 8th grade Family Suicide History: Yes (Maternal Aunt made attempt but unsuccesful) Recent stressful life event(s): Other (Comment) (Drug addiction) Persecutory voices/beliefs?: No Depression: Yes Depression Symptoms: Feeling worthless/self pity, Loss of interest in usual pleasures Substance abuse history and/or treatment for substance abuse?: Yes Suicide prevention information given to non-admitted patients: Not applicable  Risk to Others within the past 6 months Homicidal Ideation: No Does patient have any lifetime risk of violence toward others beyond the six months prior to admission? : No Thoughts of Harm to Others: No Current Homicidal Intent: No Current Homicidal Plan: No Access to Homicidal Means: No Identified Victim: None reported History of harm to others?: No Assessment of Violence: None Noted Violent Behavior Description: None reported Does patient have access to weapons?: No Criminal Charges Pending?: Yes Describe Pending Criminal Charges: Larceny by Employee Does patient have a court date: Yes Court Date: 02/09/16 Is patient on probation?: No  Psychosis Hallucinations: None noted Delusions: None noted  Mental Status Report Appearance/Hygiene: Unremarkable, In scrubs, In hospital gown Eye Contact: Good Motor Activity: Freedom of movement, Unremarkable Speech: Logical/coherent, Soft Level of Consciousness: Alert Mood: Angry, Sad Affect: Appropriate to circumstance, Sad, Angry Anxiety Level: Minimal Thought Processes: Coherent, Relevant Judgement: Partial Orientation: Person, Place, Time, Situation, Appropriate for developmental age Obsessive Compulsive Thoughts/Behaviors: None  Cognitive  Functioning Concentration: Normal Memory: Recent Intact, Remote Intact IQ: Average Insight: Fair Impulse Control: Poor Appetite: Fair Weight Loss: 30 Weight Gain: 0 Sleep: Increased Total Hours of Sleep: 12 Vegetative Symptoms: None  ADLScreening [facility] Assessment Services) Patient's cognitive ability adequate to safely complete daily activities?: Yes Patient able to express need for assistance with ADLs?: Yes Independently performs ADLs?: Yes (appropriate for developmental age)  Prior Inpatient Therapy Prior Inpatient Therapy: Yes Prior Therapy Dates: 01/2015 Prior Therapy Facilty/Provider(s): [facility] Reason for Treatment: Substance  Abuse Treatment/Detox  Prior Outpatient Therapy Prior Outpatient Therapy: Yes Prior Therapy Dates: current Prior Therapy Facilty/Provider(s): [facility] Reason for Treatment: Suboxone Clinic Does patient have an ACCT team?: No Does patient have Intensive In-House Services?  : No Does patient have [facility] services? : No Does patient have P4CC services?: No  ADL Screening (condition at time of admission) Patient's cognitive ability adequate to safely complete daily activities?: Yes Patient able to express need for assistance with ADLs?: Yes Independently performs ADLs?: Yes (appropriate for developmental age)       Abuse/Neglect Assessment (Assessment to be complete while patient is alone) Physical Abuse: Denies Verbal Abuse: Denies Sexual Abuse: Denies Exploitation of patient/patient's resources: Denies Self-Neglect: Denies Values / Beliefs Cultural Requests During Hospitalization: None Spiritual Requests During Hospitalization: None Consults Spiritual Care Consult Needed: No Social Work Consult Needed: No Merchant navy officerAdvance Directives (For Healthcare) Does patient have an advance directive?: No Would patient like information on creating an advanced directive?: No - patient declined information  Additional Information 1:1 In Past 12 Months?: No CIRT Risk: No Elopement Risk: No Does patient have medical clearance?: Yes     Disposition:  Disposition Initial Assessment Completed for this Encounter: Yes Disposition of Patient: Inpatient treatment program, Other dispositions Other disposition(s): Other (Comment) (Pt reports she has been accepted for admission at [facility].)  On Site Evaluation by:   Reviewed with Physician:    [place] 08/07/2015 2:04 AM

## 2015-08-07 NOTE — ED Notes (Signed)
ER Doctor ordered home medications and nurse awaiting verification from pharmacy, nurse did give patient a sandwich tray.

## 2015-08-07 NOTE — ED Notes (Signed)
[facility]patient assigned to this nurse, Patient is tearful,she states that she is not suicidal and that she and her mom had a argument because she did heroin, but that her mom is addicted to cocaine, and they were fighting over each others drug use, she states she did get so upset she had wanted to hurt herself, but not kill herself, " I  never wanted to hang myself" Patient denies Si/Hi or avh at this time, [facility] and does not want to miss the opportunity, patient states it took awhile to get accepted there. Patient also wants to get her medication back started, nurse told her she would talk to ER doctor and get reordered.

## 2015-08-08 DIAGNOSIS — F331 Major depressive disorder, recurrent, moderate: Secondary | ICD-10-CM

## 2015-08-08 MED ORDER — CHLORDIAZEPOXIDE HCL 25 MG PO CAPS
25.0000 mg | ORAL_CAPSULE | Freq: Three times a day (TID) | ORAL | Status: DC
  Administered 2015-08-08 – 2015-08-10 (×9): 25 mg via ORAL
  Filled 2015-08-08 (×9): qty 1

## 2015-08-08 MED ORDER — HYDROXYZINE HCL 50 MG PO TABS
50.0000 mg | ORAL_TABLET | Freq: Every day | ORAL | Status: DC
  Administered 2015-08-08 – 2015-08-11 (×5): 50 mg via ORAL
  Filled 2015-08-08 (×4): qty 1

## 2015-08-08 MED ORDER — IBUPROFEN 600 MG PO TABS
600.0000 mg | ORAL_TABLET | Freq: Four times a day (QID) | ORAL | Status: DC | PRN
  Administered 2015-08-09: 600 mg via ORAL
  Filled 2015-08-08 (×2): qty 1

## 2015-08-08 MED ORDER — OLANZAPINE 10 MG IM SOLR
10.0000 mg | Freq: Once | INTRAMUSCULAR | Status: AC
  Administered 2015-08-08: 10 mg via INTRAMUSCULAR
  Filled 2015-08-08: qty 10

## 2015-08-08 MED ORDER — OLANZAPINE 5 MG PO TBDP
10.0000 mg | ORAL_TABLET | Freq: Three times a day (TID) | ORAL | Status: DC | PRN
  Administered 2015-08-09 – 2015-08-12 (×7): 10 mg via ORAL
  Filled 2015-08-08 (×8): qty 2

## 2015-08-08 MED ORDER — PROMETHAZINE HCL 25 MG PO TABS
25.0000 mg | ORAL_TABLET | Freq: Three times a day (TID) | ORAL | Status: DC | PRN
  Administered 2015-08-10 – 2015-08-11 (×2): 25 mg via ORAL
  Filled 2015-08-08 (×3): qty 1

## 2015-08-08 MED ORDER — BENZOCAINE 10 % MT GEL
Freq: Four times a day (QID) | OROMUCOSAL | Status: DC | PRN
  Administered 2015-08-09: 07:00:00 via OROMUCOSAL
  Filled 2015-08-08: qty 9.4

## 2015-08-08 MED ORDER — LOPERAMIDE HCL 2 MG PO CAPS: 4.0000 mg | ORAL_CAPSULE | Freq: Four times a day (QID) | ORAL | Status: DC | PRN

## 2015-08-08 NOTE — Progress Notes (Signed)
D:  Patient is alert and oriented on the unit this shift.  Patient did not attend groups today.  Patient denies suicidal ideation, homicidal ideation, auditory or visual hallucinations at the present time.  Patient remained in her bed most of this shift.  Patient does not report that she feels any better today.  Patient did take a shower in hopes that this would "make me feel better." A:  Scheduled medications are administered to patient as per MD orders.  Emotional support and encouragement are provided.  Patient is maintained on q.15 minute safety checks.  Patient is informed to notify staff with questions or concerns. R:  No adverse medication reactions are noted.  Patient is cooperative with medication administration and treatment plan today.  Patient is very anxious on the unit this shift.  Patient reported to her social worker that she was looking for something in her room with which to hang herself.  Patient reported that she felt her children would be better off without her.  MD was notified.  Patient does not interact with others on the unit this shift.  Patient contracts for safety at this time.  Patient remains safe at this time.  Per MD orders, medications were administered.

## 2015-08-08 NOTE — Plan of Care (Signed)
Problem: Alteration in mood & ability to function due to Goal: STG-Patient will comply with prescribed medication regimen (Patient will comply with prescribed medication regimen)  Outcome: Progressing Patient is compliant with medication regimen currently

## 2015-08-08 NOTE — [facility] Group Notes (Signed)
[facility] LCSW Aftercare Discharge Planning Group Note  08/08/2015 9:30 AM  Participation Quality: Did Not Attend. Patient invited to participate but declined.   Carleigh Buccieri F. Caige Almeda, MSW, LCSWA, LCAS    

## 2015-08-08 NOTE — [facility] Group Notes (Signed)
[facility] Group Notes:  (Nursing/MHT/Case Management/Adjunct)  Date:  08/08/2015  Time:  5:44 AM  Type of Therapy:  Group Therapy  Participation Level:  Active  Participation Quality:  Appropriate  Affect:  Appropriate  Cognitive:  Appropriate  Insight:  Appropriate  Engagement in Group:  Engaged  Modes of Intervention:  n/a  Summary of Progress/Problems:  Teresa Hunt 08/08/2015, 5:44 AM

## 2015-08-08 NOTE — [facility] Suicide Risk Assessment (Signed)
[facility]   Nursing information obtained from:  Patient Demographic factors:    Current Mental Status:    Loss Factors:    Historical Factors:    Risk Reduction Factors:     Total Time spent with patient: 1 hour Principal Problem: Major depression, recurrent (HCC) Diagnosis:   Patient Active Problem List   Diagnosis Date Noted  . 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 Major depression, recurrent (HCC) [F33.9] 08/07/2015  . Cannabis use disorder, moderate, dependence (HCC) [F12.20] 07/11/2015  . 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 Cocaine use disorder, moderate, dependence (HCC) [F14.20] 07/11/2015  . Opioid use disorder, moderate, in controlled environment, dependence (HCC) [F11.20] 07/11/2015  . Tobacco use disorder [F17.200] 07/11/2015   Subjective Data:   Continued Clinical Symptoms:  Alcohol Use Disorder Identification Test Final Score (AUDIT): 1 The "Alcohol Use Disorders Identification Test", Guidelines for Use in Primary Care, Second Edition.  World Science writerHealth Organization [facility](WHO). Score between 0-7:  no or low risk or alcohol related problems. Score between 8-15:  moderate risk of alcohol related problems. Score between 16-19:  high risk of alcohol related problems. Score 20 or above:  warrants further diagnostic evaluation for alcohol dependence and treatment.   CLINICAL FACTORS:   Depression:   Impulsivity Alcohol/Substance Abuse/Dependencies Personality Disorders:   Cluster B Previous Psychiatric Diagnoses and Treatments   Musculoskeletal:  Psychiatric Specialty Exam: Review of Systems  Constitutional: Negative.   HENT: Negative.   Eyes: Negative.   Respiratory: Negative.   Cardiovascular: Negative.   Gastrointestinal: Negative.   Genitourinary: Negative.   Musculoskeletal: Negative.   Skin: Negative.   Neurological: Negative.   Endo/Heme/Allergies: Negative.    Psychiatric/Behavioral: Positive for depression, hallucinations and substance abuse. Negative for suicidal ideas and memory loss. The patient is not nervous/anxious and does not have insomnia.                                                             COGNITIVE FEATURES THAT CONTRIBUTE TO RISK:  None    SUICIDE RISK:   Moderate:  Frequent suicidal ideation with limited intensity, and duration, some specificity in terms of plans, no associated intent, good self-control, limited dysphoria/symptomatology, some risk factors present, and identifiable protective factors, including available and accessible social support.  [facility]  I certify that inpatient services furnished can reasonably be expected to improve the patient's condition.   Jimmy FootmanHernandez-Gonzalez,  Amario Longmore, MD 08/08/2015, 10:22 AM
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
## 2015-08-08 NOTE — Plan of Care (Signed)
Problem: Ineffective individual coping Goal: LTG: Patient will report a decrease in negative feelings Outcome: Not Progressing Patient cannot tell me that she feels better today than she did yesterday

## 2015-08-08 NOTE — [facility] Group Notes (Addendum)
[facility] LCSW Group Therapy   08/08/2015 1pm  Type of Therapy: Group Therapy   Participation Level: Did Not Attend. Patient invited to participate but declined.    Maclaine Ahola F. Bobetta Korf, MSW, LCSWA, LCAS   

## 2015-08-08 NOTE — Progress Notes (Signed)
Patient currently reports feeling a little better and slightly groggy.  Patient eats dinner in the dayroom.  Patient was moved to room 301 near the nurses' station for safety.  Patient contracts for safety on the unit.  Patient remains safe at this time.

## 2015-08-08 NOTE — Progress Notes (Signed)
Recreation Therapy Notes  INPATIENT RECREATION THERAPY ASSESSMENT  Patient Details Name: Teresa Hunt MRN: [id number] DOB: 09/11/1985 Today's Date: 08/08/2015  Patient Stressors: Family, Death, Work, Other (Comment) (Mom is s drug addict - was hanging out with patient and started drinking liquor pt was upset and attempted to hang herself; friends of OD recently; no job - was working at [employer] but used all money for drugs - got fake gifts cards to buy kid's school)supplies; gets angry quickly over small things  Coping Skills:   Isolate, Arguments, Substance Abuse, Avoidance, Self-Injury, Art/Dance, Music  Personal Challenges: Anger, Communication, Concentration, Decision-Making, Expressing Yourself, Problem-Solving, Relationships, Self-Esteem/Confidence, Social Interaction, Stress Management, Substance Abuse, Time Management, Trusting Others  Leisure Interests (2+):  Individual - Other (Comment), Art - Paint (Spend time with children)  Awareness of Community Resources:  No  Community Resources:     Current Use:    If no, Barriers?:    Patient Strengths:  "No"  Patient Identified Areas of Improvement:  Everything  Current Recreation Participation:  Getting high  Patient Goal for Hospitalization:  To get the [place] to go to rehab  [place] Residence:  [place]  County of Residence:  [place]   Current SI (including self-harm):  Yes  Current HI:  No  Consent to Intern Participation: N/A   Jacquelynn CreeGreene,Marl Seago M, LRT/CTRS 08/08/2015, 2:27 PM

## 2015-08-08 NOTE — Progress Notes (Signed)
Patient experiencing SI with a plan to hang herself.  She states that she has been searching her room for the past 2 hours for something to hang herself with.  Patient anxious and crying when discussing this with Clinical research associatewriter.  Patient states, "I love my kids but they don't deserve a mother like me.  They deserve better.  They are young and would be able to move on without me."  She reports that noises on the unit are also contributing to her anxiety.  She specifically referenced the man in the room next to her that is snoring.

## 2015-08-08 NOTE — Clinical Social Work Note (Signed)
Clinical Social Work Assessment  Patient Details  Name: Teresa Hunt MRN: [id number] Date of Birth: 12/10/1985  CSW faxed patient to [facility] for inpatient [phone] and is waiting for a bed. CSW is awaiting a tracking number from [employer] for [facility] referral and will continue to follow up.   Teresa Hunt, Teresa Charlie T, LCSW 08/08/2015, 4:39 PM

## 2015-08-08 NOTE — Progress Notes (Signed)
Recreation Therapy Notes  Date: 03.20.17 Time: 3:00 pm Location: Craft Room  Group Topic: Self-expression  Goal Area(s) Addresses:  Patient will identify one color per emotion listed on wheel. Patient will verbalize benefit of using art as a means of self-expression. Patient will be educated on other forms of self-expression.  Behavioral Response: Did not attend  Intervention: Emotion Wheel  Activity: Patients were given Emotion Wheel worksheets and instructed to pick a color for each emotion and color in the section.  Education: LRT educated patients on different forms of self-expression.  Education Outcome: Patient did not attend group.  Clinical Observations/Feedback: Patient did not attend group.  Jacquelynn CreeGreene,Stevana Dufner M, LRT/CTRS 08/08/2015 4:24 PM

## 2015-08-09 MED ORDER — MAGIC MOUTHWASH
5.0000 mL | Freq: Three times a day (TID) | ORAL | Status: DC
  Administered 2015-08-10: 5 mL via ORAL
  Filled 2015-08-09 (×4): qty 10

## 2015-08-09 MED ORDER — IBUPROFEN 800 MG PO TABS
800.0000 mg | ORAL_TABLET | Freq: Four times a day (QID) | ORAL | Status: DC | PRN
  Administered 2015-08-10 – 2015-08-12 (×5): 800 mg via ORAL
  Filled 2015-08-09 (×5): qty 1

## 2015-08-09 MED ORDER — MAGIC MOUTHWASH W/LIDOCAINE
5.0000 mL | Freq: Three times a day (TID) | ORAL | Status: DC
  Filled 2015-08-09 (×3): qty 5

## 2015-08-09 MED ORDER — ACETAMINOPHEN 500 MG PO TABS
1000.0000 mg | ORAL_TABLET | Freq: Four times a day (QID) | ORAL | Status: DC | PRN
  Administered 2015-08-09: 1000 mg via ORAL
  Filled 2015-08-09: qty 2

## 2015-08-09 MED ORDER — LIDOCAINE VISCOUS 2 % MT SOLN
5.0000 mL | Freq: Three times a day (TID) | OROMUCOSAL | Status: DC
  Administered 2015-08-10 – 2015-08-11 (×3): 5 mL via OROMUCOSAL
  Filled 2015-08-09 (×17): qty 5

## 2015-08-09 NOTE — Plan of Care (Signed)
Problem: Alteration in mood & ability to function due to Goal: STG-Patient will attend groups Outcome: Progressing Patient attended wrap up group this evening.     

## 2015-08-09 NOTE — Tx Team (Signed)
Interdisciplinary Treatment Plan Update (Adult)  Date:  08/09/2015 Time Reviewed:  3:48 PM  Progress in Treatment: Attending groups: No. Participating in groups:  No. Taking medication as prescribed:  Yes. Tolerating medication:  Yes. Family/Significant othe contact made:  No, will contact:  if patient provides consent Patient understands diagnosis:  Yes. Discussing patient identified problems/goals with staff:  Yes. Medical problems stabilized or resolved:  Yes. Denies suicidal/homicidal ideation: Yes. Issues/concerns per patient self-inventory:  Yes. Other:  New problem(s) identified: No, Describe:  none reported  Discharge Plan or Barriers: Patient will stabilize and discharge home with outpatient follow up in [facility] or IVC to [facility] if a bed is available.   Reason for Continuation of Hospitalization: Depression Medication stabilization Suicidal ideation Withdrawal symptoms  Comments:  Estimated length of stay: up to 3 days, expected discharge Friday 08/12/15  New goal(s):  Review of initial/current patient goals per problem list:   1.  Goal(s): Participate in aftercare plan   Met:  Yes  Target date: by discharge  As evidenced by: patient will participate in aftercare plan AEB aftercare provider and housing plan identified at discharge 08/09/15: Patient has identified outpatient follow up and has a home to return to that is safe for discharge. Goal met.   2.  Goal (s): Decrease depression    Met:  No  Target date: by discharge  As evidenced by: patient demonstrates decreased symptoms of depression and reports a Depression rating of 3 or less 08/09/15: Patient still passively suicidal but contracts for safety.  3.  Goal(s): Patient will demonstrate decreased signs of withdrawal due to substance abuse    Met:  No  Target date: by discharge  As evidenced by: Patient will produce a CIWA/COWS score of 0, have stable vitals signs, and no symptoms of  withdrawal 08/09/15: Patient remains anxious and blood pressure was 111/92 today.   Attendees: Physician:  Merlyn Albert, MD 3/21/20173:48 PM  Nursing:   Carolynn Sayers, RN 3/21/20173:48 PM  Other:  Carmell Austria, [place] 3/21/20173:48 PM  Other:  Everitt Amber, [place] 3/21/20173:48 PM  Other:   3/21/20173:48 PM  Other:  3/21/20173:48 PM  Other:  3/21/20173:48 PM  Other:  3/21/20173:48 PM  Other:  3/21/20173:48 PM  Other:  3/21/20173:48 PM  Other:  3/21/20173:48 PM  Other:   3/21/20173:48 PM   Scribe for Treatment Team:   Keene Breath, MSW, [place]  08/09/2015, 3:48 PM

## 2015-08-09 NOTE — Progress Notes (Signed)
Patient with anxious affect, cooperative behavior with meals, meds and plan of care. Patient hyperverbal and stating she is ready to discharge this am. Patient attends therapy group and then has increasing anxiety and triggers for OCD behaviors of counting. Prns Zyprexa with good results. Encouraged therapy groups to learn and initiate coping skills for management of stressors and diagnosis. No SI/HI/SN at this time. Social with peers, verbalizes needs to staff. Safety maintained.

## 2015-08-09 NOTE — [facility] Group Notes (Signed)
[facility] LCSW Group Therapy   08/09/2015 11:00 am  Type of Therapy: Group Therapy   Participation Level: Active   Participation Quality: Attentive, Sharing and Supportive   Affect: Appropriate  Cognitive: Alert and Oriented   Insight: Developing/Improving and Engaged   Engagement in Therapy: Developing/Improving and Engaged   Modes of Intervention: Clarification, Confrontation, Discussion, Education, Exploration,  Limit-setting, Orientation, Problem-solving, Rapport Building, Dance movement psychotherapisteality Testing, Socialization and Support  Summary of Progress/Problems: The topic for group therapy was feelings about diagnosis. Pt actively participated in group discussion on their past and current diagnosis and how they feel towards this. Pt also identified how society and family members judge them, based on their diagnosis as well as stereotypes and stigmas. Pt reported she feels she has been judged by her family and friends due to her diagnoses of Bi-Polar and PTSD.  Pt reported she feels as if others are tip-toeing around her due to her diagnoses.  Pt presented as distraught when describing her thoughts, feelings and emotions regarding her emotions.  Pt was polite and cooperative with the CSW and other group members and focused and attentive to the topics discussed and the sharing of others.     Dorothe PeaJonathan F. Venesha Petraitis, LCSWA, LCAS

## 2015-08-09 NOTE — Progress Notes (Signed)
Recreation Therapy Notes  Date: 03.21.17 Time: 3:10 pm Location: Craft Room  Group Topic: Goal Setting  Goal Area(s) Addresses:  Patient will write down at least one goal. Patient will write down at least one obstacle.  Behavioral Response: Did not attend  Intervention: Recovery Goal Chart  Activity: Patients were instructed to make a Recovery Goal Chart including goals, obstacles, the date the started working on their goal, and the date they achieved their goal.  Education: LRT educated patients on healthy ways to celebrate reaching their goals.  Education Outcome: Patient did not attend group.  Clinical Observations/Feedback: Patient did not attend group.  Jacquelynn CreeGreene,Jozette Castrellon M, LRT/CTRS 08/09/2015 4:08 PM

## 2015-08-09 NOTE — Clinical Social Work Note (Signed)
Clinical Social Work Assessment  Patient Details  Name: Teresa MendCortney N Burda MRN: [id number] Date of Birth: 08/03/1985  CSW called [employer] for Care Coordinator referral and found patient has a Care Coordinator Floydene Flockancy Batchelor [phone]. Patient Care Coordinator will meet with patient Wednesday 08/10/15 as she has been trying to contact patient with no success. Patient is agreeable to this meeting. Patient is referred and awaiting a bed at [facility].   Lulu Ridingngle, Kenlee Maler T, LCSW 08/09/2015, 3:16 PM

## 2015-08-09 NOTE — Plan of Care (Signed)
Problem[facility] Education Outcome: Progressing Patient participating in plan of care. Does remain anxious at times. Focus on meds, poor coping skills.
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
## 2015-08-09 NOTE — Progress Notes (Signed)
[facility] MD Progress Note  08/09/2015 9:06 AM Teresa Hunt  MRN:  [id number] Subjective:  Today she says she is much better and not suicidal.  She wants to be discharge today. She is willing to go to rehab but is afraid were are going to hold her here for many days until them. Denies SI, HI or hallucinations today.  Says she is doing better as far as anxiety, received zyprexa IM yesterday.   Denies problems with appetite, energy, sleep or concentration.  Yesterday afternoon patient went to the nurses and told them she was having thoughts about hanging herself. She felt guilty about her substance use and how that was affecting her children. She was thinking that everybody her family would be better off without her. She requested medications to help her calm down. She also reported to nurses that she was abusing Xanax at home and was actually injecting them. Nurses move her closer to the nurses station for close monitoring. She receive olanzapine once.   Per nursing: D: Patient states she's increasingly anxious and asks for something to calm down. She states being around some of her peers is causing her to be anxious. She has passive SI but contracts for safety. She denies HI and AVH. States pain at a 9 in her mouth due to an abscess. A: Medication was given with education. Encouragement was provided. Scheduled vistaril was given for anxiety. MD on call was notified about pain and orajel was ordered PRN.  R: Patient was compliant with medication. She has remained calm and cooperative. Patient attended groups. Safety maintained with 15 min checks.   Principal Problem: Major depression, recurrent (HCC) Diagnosis:   Patient Active Problem List   Diagnosis Date Noted  . 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 Major depression, recurrent (HCC) [F33.9] 08/07/2015  . Cannabis use disorder, moderate, dependence (HCC) [F12.20] 07/11/2015  . Sedative, hypnotic or anxiolytic use disorder, mild, abuse  [F13.10] 07/11/2015  . Cocaine use disorder, moderate, dependence (HCC) [F14.20] 07/11/2015  . Opioid use disorder, moderate, in controlled environment, dependence (HCC) [F11.20] 07/11/2015  . Tobacco use disorder [F17.200] 07/11/2015   Total Time spent with patient: 30 minutes   Past Medical History:  Past Medical History  Diagnosis Date  . UTI (lower urinary tract infection)   . Drug abuse   . Bipolar affect, depressed (HCC)   . Asthma   . Heart murmur     Past Surgical History  Procedure Laterality Date  . Cesarean section    . Exploratory laparotomy     Social History:  History  Alcohol Use  . Yes    Comment: sometimes     History  Drug Use  . Yes  . Special: IV, Cocaine, Marijuana, Benzodiazepines, Other-see comments    Social History   Social History  . Marital Status: Divorced    Spouse Name: N/A  . Number of Children: N/A  . Years of Education: N/A   Social History Main Topics  . Smoking status: Current Every Day Smoker -- 1.00 packs/day    Types: Cigarettes  . Smokeless tobacco: None  . Alcohol Use: Yes     Comment: sometimes  . Drug Use: Yes    Special: IV, Cocaine, Marijuana, Benzodiazepines, Other-see comments  . Sexual Activity: Yes   Other Topics Concern  . None   Social History Narrative   Current Medications: Current Facility-Administered Medications  Medication Dose Route Frequency Provider Last Rate Last Dose  . acetaminophen (TYLENOL) tablet 650 mg  650 mg Oral Q6H PRN Jimmy FootmanAndrea Hernandez-Gonzalez, MD   650 mg at 08/08/15 2101  . alum & mag hydroxide-simeth (MAALOX/MYLANTA) 200-200-20 MG/5ML suspension 30 mL  30 mL Oral Q4H PRN Jimmy FootmanAndrea Hernandez-Gonzalez, MD      . benzocaine (ORAJEL) 10 % mucosal gel   Mouth/Throat QID PRN Jolanta B Pucilowska, MD      . chlordiazePOXIDE (LIBRIUM) capsule 25 mg  25 mg Oral TID WC & HS Jimmy FootmanAndrea Hernandez-Gonzalez, MD   25 mg at 08/09/15 0800  . fluvoxaMINE (LUVOX) tablet 100 mg  100 mg Oral Daily Jimmy FootmanAndrea  Hernandez-Gonzalez, MD   100 mg at 08/08/15 2101  . hydrOXYzine (ATARAX/VISTARIL) tablet 50 mg  50 mg Oral TID PRN Jimmy FootmanAndrea Hernandez-Gonzalez, MD   50 mg at 08/07/15 1949  . hydrOXYzine (ATARAX/VISTARIL) tablet 50 mg  50 mg Oral QHS Jimmy FootmanAndrea Hernandez-Gonzalez, MD   50 mg at 08/08/15 2101  . ibuprofen (ADVIL,MOTRIN) tablet 600 mg  600 mg Oral Q6H PRN Jimmy FootmanAndrea Hernandez-Gonzalez, MD      . loperamide (IMODIUM) capsule 4 mg  4 mg Oral QID PRN Jimmy FootmanAndrea Hernandez-Gonzalez, MD      . magnesium hydroxide (MILK OF MAGNESIA) suspension 30 mL  30 mL Oral Daily PRN Jimmy FootmanAndrea Hernandez-Gonzalez, MD      . nicotine (NICODERM CQ - dosed in mg/24 hours) patch 21 mg  21 mg Transdermal Q0600 Jimmy FootmanAndrea Hernandez-Gonzalez, MD   21 mg at 08/09/15 [id number]  . OLANZapine zydis (ZYPREXA) disintegrating tablet 10 mg  10 mg Oral TID PRN Jimmy FootmanAndrea Hernandez-Gonzalez, MD      . promethazine (PHENERGAN) tablet 25 mg  25 mg Oral Q8H PRN Jimmy FootmanAndrea Hernandez-Gonzalez, MD        Lab Results: No results found for this or any previous visit (from the past 48 hour(s)).  Blood Alcohol level:  Lab Results  Component Value Date   ETH <5 08/06/2015   ETH <5 07/10/2015    Physical Findings: AIMS: Facial and Oral Movements Muscles of Facial Expression: None, normal Lips and Perioral Area: None, normal Jaw: None, normal Tongue: None, normal,Extremity Movements Upper (arms, wrists, hands, fingers): None, normal Lower (legs, knees, ankles, toes): None, normal, Trunk Movements Neck, shoulders, hips: None, normal, Overall Severity Severity of abnormal movements (highest score from questions above): None, normal Incapacitation due to abnormal movements: None, normal Patient's awareness of abnormal movements (rate only patient's report): No Awareness, Dental Status Current problems with teeth and/or dentures?: No Does patient usually wear dentures?: No  CIWA:  CIWA-Ar Total: 5 COWS:  COWS Total Score: 4  Musculoskeletal: Strength & Muscle Tone:  within normal limits Gait & Station: normal Patient leans: N/A  Psychiatric Specialty Exam: Review of Systems  Constitutional: Negative.   HENT: Negative.   Eyes: Negative.   Respiratory: Negative.   Cardiovascular: Negative.   Gastrointestinal: Negative.   Genitourinary: Negative.   Musculoskeletal: Negative.   Skin: Negative.   Neurological: Negative.   Endo/Heme/Allergies: Negative.   Psychiatric/Behavioral: Positive for depression and substance abuse. Negative for suicidal ideas. The patient is nervous/anxious.     Blood pressure 111/92, pulse 62, temperature 97.9 F (36.6 C), temperature source Oral, resp. rate 18, height 5\' 4"  (1.626 m), weight 49.215 kg (108 lb 8 oz), last menstrual period 07/10/2015, SpO2 100 %.Body mass index is 18.61 kg/(m^2).  General Appearance: Disheveled  Eye SolicitorContact::  Fair  Speech:  Clear and Coherent  Volume:  Normal  Mood:  Anxious and Dysphoric  Affect:  Constricted  Thought Process:  Linear  Orientation:  Full (Time, Place, and Person)  Thought Content:  Hallucinations: None  Suicidal Thoughts:  No  Homicidal Thoughts:  No  Memory:  Immediate;   Good Recent;   Good Remote;   Good  Judgement:  Poor  Insight:  Shallow  Psychomotor Activity:  Decreased  Concentration:  Good  Recall:  Good  Fund of Knowledge:Good  Language: Good  Akathisia:  No  Handed:    AIMS (if indicated):     Assets:  Architect Physical Health  ADL's:  Intact  Cognition: WNL  Sleep:  Number of Hours: 8.25   Treatment Plan Summary: Daily contact with patient to assess and evaluate symptoms and progress in treatment and Medication management   Patient is a 30 year old single Caucasian female who was brought into our emergency department after making suicidal threats. This patient was just discharged last month after a suicidal attempt by overdose. The patient describes herself as unpredictable and impulsive. Patient has  been referred to [facility] by [facility] as she continues to use cocaine and heroine. Patient states she was trying to "get high" prior to going to rehabilitation.  Family history concerned about her safety. They feel that the patient is a threat to herself as she has attempted suicide before and she is very impulsive. They feel patient is "out of control".   Major depressive disorder/OCD/PTSD: Continue Luvox 100 mg by mouth daily at bedtime  PTSD: should improve with luvox. No longer on prazosin as she felt "not herself" when on it.  For insomnia: continue Vistaril 50 mg by mouth daily at bedtime.   Anxiety: Patient has been started on Vistaril 50 mg every 6 hours as needed  Agitation: continue olanzapine 10 mg tid prn  Opiate dependence: Patient is currently prescribed with 8 mg of Suboxone by [employer]. I have asked the patient to tell her boyfriend to bring her Suboxone from home so we can use that medication here at the hospital. Patient is saying she has not been taking Suboxone for the last couple of days, she says that is not in her system as she last took this medication on Thursday. She is asking what will happen if her boyfriend cannot bring Suboxone ("what would happen if he can't find it?"). I have concerns that the patient might be selling or misusing the Suboxone. I will not restart Suboxone here in the hospital. We'll wait for the family to hopefully bring her Suboxone from home. Hospital does not carry suboxone only subutex.  Opiate withdrawal: Patient says she is in withdrawal however I do not see any objective evidence of a hermit in withdrawal. She is not reporting nausea, vomiting, diarrhea, pain and abdominal cramps . She is not sweating. Vital signs are within the normal limits she's sleeping well and eating. Again her urine toxicology was negative for any opiates. She says she was injecting cocaine prior to admission. In case she develops symptoms of withdrawal have ordered  Phenergan, Imodium and ibuprofen when necessary.  Benzodiazepine withdrawal: Patient was positive for benzodiazepines upon admission. She told nursing that she was abusing Xanax and injecting it. She has been is started on Librium 25 mg 4 times a day.  Tobacco use disorder: Patient nicotine patch 21 mg a day  Precautions every 15 minute checks  Hospitalization and status under involuntary commitment  Disposition: [facility] referral made.  Pt in agreement with admission to rehab.  Jimmy Footman, MD 08/09/2015, 9:06 AM

## 2015-08-09 NOTE — Progress Notes (Signed)
Patient discharged at this time to mother for private car transportation home. Patient met with mother and Education officer, museum. Patient acknowledges all belongings are returned. Patient and Mother verbalize understanding rt recommended discharge plan of care. No SI/HI/SH at this time. Safety maintained.

## 2015-08-09 NOTE — Plan of Care (Signed)
Problem: [facility] Participation in Recreation Therapeutic Interventions Goal: STG-Patient will demonstrate improved self esteem by identif STG: Self-Esteem - Within 4 treatment sessions, patient will verbalize at least 5 positive affirmation statements in each of 2 treatment sessions to increase self-esteem post d/c.  Outcome: Progressing Treatment Session 1; Completed 1 out of 2: At approximately 12:00 pm, LRT met with patient in craft room. Patient verbalized 5 positive affirmation statements. Patient reported it felt "weird". LRT encouraged patient to continue saying positive affirmation statements. Intervention Used: I Am statements  Leonette Monarch, LRT/CTRS 03.21.17 12:30 pm Goal: STG-Patient will identify at least five coping skills for ** STG: Coping Skills - Within 4 treatment sessions, patient will verbalize at least 5 coping skills for substance abuse in each of 2 treatment sessions to decrease substance abuse post d/c.  Outcome: Progressing Treatment Session 1; Completed 1 out of 2: At approximately 12:00 pm, LRT met with patient in craft room. Patient verbalized 5 coping skills for substance abuse. LRT educated patient on leisure and why it is important to implement into her schedule. LRT educated and provided patient with blank schedules to help her plan her day and try to avoid using substances. LRT educated patient on healthy support systems. Intervention Used: Coping Skills worksheet  Leonette Monarch, LRT/CTRS 03.21.17 12:36 pm

## 2015-08-09 NOTE — Progress Notes (Signed)
D: Patient states she's increasingly anxious and asks for something to calm down. She states being around some of her peers is causing her to be anxious. She has passive SI but contracts for safety. She denies HI and AVH. States pain at a 9 in her mouth due to an abscess. A: Medication was given with education. Encouragement was provided. Scheduled vistaril was given for anxiety. MD on call was notified about pain and orajel was ordered PRN.  R: Patient was compliant with medication. She has remained calm and cooperative. Patient attended groups. Safety maintained with 15 min checks.

## 2015-08-10 MED ORDER — CHLORDIAZEPOXIDE HCL 25 MG PO CAPS
25.0000 mg | ORAL_CAPSULE | Freq: Three times a day (TID) | ORAL | Status: DC
  Administered 2015-08-10 – 2015-08-12 (×6): 25 mg via ORAL
  Filled 2015-08-10 (×6): qty 1

## 2015-08-10 NOTE — [facility] Suicide Risk Assessment (Signed)
[facility] INPATIENT:  Family/Significant Other Suicide Prevention Education  Suicide Prevention Education:  Education Completed; Teresa Hunt (boyfriend) [phone] has been identified by the patient as the family member/significant other with whom the patient will be residing, and identified as the person(s) who will aid the patient in the event of a mental health crisis (suicidal ideations/suicide attempt).  With written consent from the patient, the family member/significant other has been provided the following suicide prevention education, prior to the and/or following the discharge of the patient.  The suicide prevention education provided includes the following:  Suicide risk factors  Suicide prevention and interventions  National Suicide Hotline telephone number  [facility] assessment telephone number  [facility] Emergency Assistance 911  [facility] and/or Residential Mobile Crisis Unit telephone number  Request made of family/significant other to:  Remove weapons (e.g., guns, rifles, knives), all items previously/currently identified as safety concern.    Remove drugs/medications (over-the-counter, prescriptions, illicit drugs), all items previously/currently identified as a safety concern.  The family member/significant other verbalizes understanding of the suicide prevention education information provided.  The family member/significant other agrees to remove the items of safety concern listed above.  Lulu RidingIngle, Koraline Phillipson T, MSW, LCSWA 08/10/2015, 2:43 PM

## 2015-08-10 NOTE — Plan of Care (Signed)
Problem: [facility] Patient Education Outcome: Not Progressing Patient remains focused on meds, poor coping skills.

## 2015-08-10 NOTE — Progress Notes (Signed)
Patient with sad affect, anxious and teary at times. No SI/HI/SH. Patient states her goal is to "discharge today". Reports she can not "attend therapy groups" to learn and initiate coping skills for management of stressors because "they make me more anxious". Prn Zyprexa as ordered with fair effect. Safety maintained.

## 2015-08-10 NOTE — Plan of Care (Signed)
Problem: [facility] Participation in Recreation Therapeutic Interventions Goal: STG-Patient will demonstrate improved self esteem by identif STG: Self-Esteem - Within 4 treatment sessions, patient will verbalize at least 5 positive affirmation statements in each of 2 treatment sessions to increase self-esteem post d/c.  Outcome: Completed/Met Date Met:  08/10/15 Treatment Session 2; Completed 2 out of 2: At approximately 12:25 pm, LRT met with patient in patient room. Patient verbalized 5 positive affirmation statements. Patient reported it felt "good". LRT encouraged patient to continue saying positive affirmation statements. Intervention Used: I Am statements  Leonette Monarch, LRT/CTRS [phone] 3:31 pm Goal: STG-Patient will identify at least five coping skills for ** STG: Coping Skills - Within 4 treatment sessions, patient will verbalize at least 5 coping skills for substance abuse in each of 2 treatment sessions to decrease substance abuse post d/c.  Outcome: Completed/Met Date Met:  08/10/15 Treatment Session 2; Completed 2 out of 2: At approximately 12:25 pm, LRT met with patient in patient room. Patient verbalized 5 coping skills for substance abuse. LRT encouraged patient to participate in leisure activities. Intervention Used: Coping Skills worksheet  Leonette Monarch, LRT/CTRS 03.22.17 3:33 pm

## 2015-08-10 NOTE — Progress Notes (Signed)
Recreation Therapy Notes  Date: 03.22.17 Time: 1:00 pm Location: Craft Room  Group Topic: Self-esteem  Goal Area(s) Addresses:  Patient will write at least one positive trait about self. Patient will verbalize benefit of having a healthy self-esteem.  Behavioral Response: Did not attend  Intervention: I Am  Activity: Patients were given a worksheet with the letter I on it and instructed to write as many positive traits about themselves inside the letter I.  Education: LRT educated patients on ways they can increase their self-esteem.  Education Outcome: Patient did not attend group.   Clinical Observations/Feedback: Patient did not attend group.  Jacquelynn CreeGreene,Trelyn Vanderlinde M, LRT/CTRS 08/10/2015 2:56 PM

## 2015-08-10 NOTE — Progress Notes (Signed)
Writer noticed patient outside of her room crying against the wall. Face covered. Boyfriend noted standing in hall waiting for patient. Support given and patient able to return to speak with boyfriend in dayroom. Safety maintained.

## 2015-08-10 NOTE — [facility] Group Notes (Signed)
[facility] Group Notes:  (Nursing/MHT/Case Management/Adjunct)  Date:  08/10/2015  Time:  1:07 AM  Type of Therapy:  Group Therapy  Participation Level:  Active  Participation Quality:  Attentive and Sharing  Affect:  Tearful  Cognitive:  Alert  Insight:  Good  Engagement in Group:  Engaged  Modes of Intervention:  Discussion  Summary of Progress/Problems: Pt was very tearful during group. Her twin boys birthday is tomorrow and she was very emotional about not being there. Staff was able to calm Pt by reassuring her that being here was the best thing for her and her family. Pt stated that she agreed, and wanted her children to be proud of her.   Fanny Skatesshley Imani Lakasha Mcfall 08/10/2015, 1:07 AM

## 2015-08-10 NOTE — [facility] Group Notes (Addendum)
[facility]   08/10/2015 9:15 AM  ?  Participation Quality: Alert, Appropriate and Oriented   Mood/Affect: Depressed and Flat   Depression Rating: 10  Anxiety Rating: 10  Thoughts of Suicide: Pt denies SI/HI   Will you contract for safety? Yes   Current AVH: Pt denies   Plan for Discharge/Comments: Pt attended discharge planning group and actively participated in group. CSW provided pt with today's workbook. Pt's goal is to "go home".  Pt shared she was afraid to say how she really feels, regarding anxiety and depression, because the hospital might not let her be discharged.  Pt shared she feels that she cannot stabilize further and that she as if "I am coming out of my skin".  Pt was polite and cooperative with the CSW and other group members and focused and attentive to the topics discussed and the sharing of others, despite her presenting as agitated and irritable.   Transportation Means: Pt reports access to transportation   Supports: Pt lists family and friends as primary supports    Dorothe PeaJonathan F. Anadia Helmes, MSW, LCSWA, LCAS

## 2015-08-10 NOTE — Progress Notes (Signed)
D: Patient has been slightly labile this evening. Patient keeps stating she wants to go home [facility]. She states she wants to spend time with her kids before she has to leave. At one point during the evening she got off the phone and started tearing up. She stated she found out she was missing her "twins birthday" because she was going to be in here. She denies SI/HI/AVH at this time. Still states pain in her mouth from the abscess but refuses magic mouthwash. [facility] was restless and asked for something to help her sleep  A: Medication was given with education. Encouragement was provided. PRN zyprexa was given at HS. R: Patient was compliant with medication. She has been calm and cooperative. Safety maintained with 15 min checks.

## 2015-08-10 NOTE — [facility] Group Notes (Signed)
[facility] LCSW Group Therapy   08/10/2015 1pm  Type of Therapy: Group Therapy   Participation Level: Did Not Attend. Patient invited to participate but declined.    Stephinie Battisti F. Tamorah Hada, MSW, LCSWA, LCAS   

## 2015-08-10 NOTE — [facility] Group Notes (Signed)
[facility] Group Notes:  (Nursing/MHT/Case Management/Adjunct)  Date:  08/10/2015  Time:  2:08 PM  Type of Therapy:  Psychoeducational Skills  Participation Level:  Minimal  Participation Quality:  Appropriate, Attentive, Sharing and left early  Affect:  Appropriate  Cognitive:  Alert and Appropriate  Insight:  Appropriate  Engagement in Group:  Engaged and left early  Modes of Intervention:  Discussion, Education and Support  Summary of Progress/Problems:  Lynelle SmokeCara Travis Lear Carstens 08/10/2015, 2:08 PM

## 2015-08-10 NOTE — Progress Notes (Signed)
[facility] MD Progress Note  08/10/2015 2:24 PM Teresa Hunt  MRN:  [id number] Subjective:  Today she says she is much better and not suicidal.  She wants to be discharge today. Patient says her joints per day is tomorrow. She says he wouldn't let her go she's gonna moving with her father who will monitor her 24/ 7 until she gets to [facility].  Family has indicated that they do not feel safe with patient returning home as she is very unstable and impulsive. The reason why she was brought back to the hospital is because she was trying to get intoxicated versus hurt herself prior to admission to rehabilitation.  This patient has prior history of substance abuse. She is very manipulative and has lied to as multitude of times.  Her mood is labile and sutures from being euthymic to anxious and suicidal in a matter of minutes.  We did not feel safe discharging this patient home. The ideal  situation will be for her to go to [facility] directly from here to assure her safety .  She appears to be very ambivalent about treatment and fearful about "going away".     Per Child psychotherapist [facility] has her in the waiting list and they will admit her as soon as they have a discharge.     Patient has been attending programming. She has been cooperative with nurses. She is willing to go to [facility].  Monday 3/20:  patient went to the nurses and told them she was having thoughts about hanging herself. She felt guilty about her substance use and how that was affecting her children. She was thinking that everybody her family would be better off without her. She requested medications to help her calm down. She also reported to nurses that she was abusing Xanax at home and was actually injecting them. Nurses move her closer to the nurses station for close monitoring. She receive olanzapine once.   Per nursing: D: Patient states she's increasingly anxious and asks for something to calm down. She states being around some of her peers is causing  her to be anxious. She has passive SI but contracts for safety. She denies HI and AVH. States pain at a 9 in her mouth due to an abscess. A: Medication was given with education. Encouragement was provided. Scheduled vistaril was given for anxiety. MD on call was notified about pain and orajel was ordered PRN.  R: Patient was compliant with medication. She has remained calm and cooperative. Patient attended groups. Safety maintained with 15 min checks.   Principal Problem: Major depression, recurrent (HCC) Diagnosis:   Patient Active Problem List   Diagnosis Date Noted  . 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 Major depression, recurrent (HCC) [F33.9] 08/07/2015  . Cannabis use disorder, moderate, dependence (HCC) [F12.20] 07/11/2015  . 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 Cocaine use disorder, moderate, dependence (HCC) [F14.20] 07/11/2015  . Opioid use disorder, moderate, in controlled environment, dependence (HCC) [F11.20] 07/11/2015  . Tobacco use disorder [F17.200] 07/11/2015   Total Time spent with patient: 30 minutes   Past Medical History:  Past Medical History  Diagnosis Date  . UTI (lower urinary tract infection)   . Drug abuse   . Bipolar affect, depressed (HCC)   . Asthma   . Heart murmur     Past Surgical History  Procedure Laterality Date  . Cesarean section    . Exploratory laparotomy     Social History:  History  Alcohol Use  . Yes    Comment: sometimes     History  Drug Use  . Yes  . Special: IV, Cocaine, Marijuana, Benzodiazepines, Other-see comments    Social History   Social History  . Marital Status: Divorced    Spouse Name: N/A  . Number of Children: N/A  . Years of Education: N/A   Social History Main Topics  . Smoking status: Current Every Day Smoker -- 1.00 packs/day    Types: Cigarettes  . Smokeless tobacco: None  . Alcohol Use: Yes     Comment: sometimes  . Drug Use: Yes    Special:  IV, Cocaine, Marijuana, Benzodiazepines, Other-see comments  . Sexual Activity: Yes   Other Topics Concern  . None   Social History Narrative   Current Medications: Current Facility-Administered Medications  Medication Dose Route Frequency Provider Last Rate Last Dose  . acetaminophen (TYLENOL) tablet 1,000 mg  1,000 mg Oral Q6H PRN Jimmy Footman, MD   1,000 mg at 08/09/15 2114  . alum & mag hydroxide-simeth (MAALOX/MYLANTA) 200-200-20 MG/5ML suspension 30 mL  30 mL Oral Q4H PRN Jimmy Footman, MD      . benzocaine (ORAJEL) 10 % mucosal gel   Mouth/Throat QID PRN Jolanta B Pucilowska, MD      . chlordiazePOXIDE (LIBRIUM) capsule 25 mg  25 mg Oral TID WC & HS Jimmy Footman, MD   25 mg at 08/10/15 1111  . fluvoxaMINE (LUVOX) tablet 100 mg  100 mg Oral Daily Jimmy Footman, MD   100 mg at 08/09/15 2114  . hydrOXYzine (ATARAX/VISTARIL) tablet 50 mg  50 mg Oral TID PRN Jimmy Footman, MD   50 mg at 08/10/15 1113  . hydrOXYzine (ATARAX/VISTARIL) tablet 50 mg  50 mg Oral QHS Jimmy Footman, MD   50 mg at 08/09/15 2114  . ibuprofen (ADVIL,MOTRIN) tablet 800 mg  800 mg Oral Q6H PRN Jimmy Footman, MD   800 mg at 08/10/15 1610  . magic mouthwash  5 mL Oral TID PC & HS Jimmy Footman, MD   5 mL at 08/09/15 1724   And  . lidocaine (XYLOCAINE) 2 % viscous mouth solution 5 mL  5 mL Mouth/Throat TID PC & HS Jimmy Footman, MD   5 mL at 08/10/15 9604  . loperamide (IMODIUM) capsule 4 mg  4 mg Oral QID PRN Jimmy Footman, MD      . magnesium hydroxide (MILK OF MAGNESIA) suspension 30 mL  30 mL Oral Daily PRN Jimmy Footman, MD      . nicotine (NICODERM CQ - dosed in mg/24 hours) patch 21 mg  21 mg Transdermal Q0600 Jimmy Footman, MD   21 mg at 08/10/15 5409  . OLANZapine zydis (ZYPREXA) disintegrating tablet 10 mg  10 mg Oral TID PRN Jimmy Footman, MD   10 mg at  08/10/15 0932  . promethazine (PHENERGAN) tablet 25 mg  25 mg Oral Q8H PRN Jimmy Footman, MD        Lab Results: No results found for this or any previous visit (from the past 48 hour(s)).  Blood Alcohol level:  Lab Results  Component Value Date   ETH <5 08/06/2015   ETH <5 07/10/2015    Physical Findings: AIMS: Facial and Oral Movements Muscles of Facial Expression: None, normal Lips and Perioral Area: None, normal Jaw: None, normal Tongue: None, normal,Extremity Movements Upper (arms, wrists, hands, fingers): None, normal Lower (legs, knees, ankles, toes): None, normal, Trunk Movements Neck, shoulders, hips: None, normal, Overall Severity  Severity of abnormal movements (highest score from questions above): None, normal Incapacitation due to abnormal movements: None, normal Patient's awareness of abnormal movements (rate only patient's report): No Awareness, Dental Status Current problems with teeth and/or dentures?: No Does patient usually wear dentures?: No  CIWA:  CIWA-Ar Total: 5 COWS:  COWS Total Score: 4  Musculoskeletal: Strength & Muscle Tone: within normal limits Gait & Station: normal Patient leans: N/A  Psychiatric Specialty Exam: Review of Systems  Constitutional: Negative.   HENT: Negative.   Eyes: Negative.   Respiratory: Negative.   Cardiovascular: Negative.   Gastrointestinal: Negative.   Genitourinary: Negative.   Musculoskeletal: Negative.   Skin: Negative.   Neurological: Negative.   Endo/Heme/Allergies: Negative.   Psychiatric/Behavioral: Positive for depression and substance abuse. Negative for suicidal ideas. The patient is nervous/anxious.     Blood pressure 120/80, pulse 60, temperature 97.6 F (36.4 C), temperature source Oral, resp. rate 18, height 5\' 4"  (1.626 m), weight 49.215 kg (108 lb 8 oz), last menstrual period 07/10/2015, SpO2 100 %.Body mass index is 18.61 kg/(m^2).  General Appearance: Fairly Groomed  Patent attorney::   Fair  Speech:  Clear and Coherent  Volume:  Normal  Mood:  Anxious and Dysphoric  Affect:  Constricted  Thought Process:  Linear  Orientation:  Full (Time, Place, and Person)  Thought Content:  Hallucinations: None  Suicidal Thoughts:  No  Homicidal Thoughts:  No  Memory:  Immediate;   Good Recent;   Good Remote;   Good  Judgement:  Poor  Insight:  Shallow  Psychomotor Activity:  Decreased  Concentration:  Good  Recall:  Good  Fund of Knowledge:Good  Language: Good  Akathisia:  No  Handed:    AIMS (if indicated):     Assets:  Architect Physical Health  ADL's:  Intact  Cognition: WNL  Sleep:  Number of Hours: 6.5   Treatment Plan Summary: Daily contact with patient to assess and evaluate symptoms and progress in treatment and Medication management   Patient is a 30 year old single Caucasian female who was brought into our emergency department after making suicidal threats. This patient was just discharged last month after a suicidal attempt by overdose. The patient describes herself as unpredictable and impulsive. Patient has been referred to [facility] by [facility] as she continues to use cocaine and heroine. Patient states she was trying to "get high" prior to going to rehabilitation.  Family history concerned about her safety. They feel that the patient is a threat to herself as she has attempted suicide before and she is very impulsive. They feel patient is "out of control".   Major depressive disorder/OCD/PTSD: Continue Luvox 100 mg by mouth daily at bedtime  PTSD: should improve with luvox. No longer on prazosin as she felt "not herself" when on it.  For insomnia: continue Vistaril 50 mg by mouth daily at bedtime.   Anxiety: Patient has been started on Vistaril 50 mg every 6 hours as needed  Agitation: continue olanzapine 10 mg tid prn  Opiate dependence: Patient is currently prescribed with 8 mg of Suboxone by [employer]. I have  asked the patient to tell her boyfriend to bring her Suboxone from home so we can use that medication here at the hospital. Patient is saying she has not been taking Suboxone for the last couple of days, she says that is not in her system as she last took this medication on Thursday. She is asking what will happen if her boyfriend cannot bring  Suboxone ("what would happen if he can't find it?"). I have concerns that the patient might be selling or misusing the Suboxone. I will not restart Suboxone here in the hospital. We'll wait for the family to hopefully bring her Suboxone from home. Hospital does not carry suboxone only subutex.  Opiate withdrawal: Patient says she is in withdrawal however I do not see any objective evidence of a hermit in withdrawal. She is not reporting nausea, vomiting, diarrhea, pain and abdominal cramps . She is not sweating. Vital signs are within the normal limits she's sleeping well and eating. Again her urine toxicology was negative for any opiates. She says she was injecting cocaine prior to admission. In case she develops symptoms of withdrawal have ordered Phenergan, Imodium and ibuprofen when necessary.  Benzodiazepine withdrawal: Patient was positive for benzodiazepines upon admission. She told nursing that she was abusing Xanax and injecting it. She has been is started on Librium taper.  Tooth pain: Yesterday the patient reporting severe pain. Patient stated that the pain had not improved with Tylenol and ibuprofen. Explained to her that she was not going to be prescribed with opiates. Mild was with lidocaine order. Has not complained about pain since then.  Tobacco use disorder: Patient nicotine patch 21 mg a day  Precautions every 15 minute checks  Hospitalization and status under involuntary commitment  Disposition: [facility] referral made.  Pt in agreement with admission to rehab.  Jimmy FootmanHernandez-Gonzalez,  Bookert Guzzi, MD 08/10/2015, 2:24 PM

## 2015-08-10 NOTE — [facility] Group Notes (Signed)
[facility] Group Notes:  (Nursing/MHT/Case Management/Adjunct)  Date:  08/10/2015  Time:  9:19 PM  Type of Therapy:  Evening Wrap-up Group  Participation Level:  Active  Participation Quality:  Appropriate, Attentive and Sharing  Affect:  Appropriate  Cognitive:  Alert and Appropriate  Insight:  Good  Engagement in Group:  Developing/Improving  Modes of Intervention:  Discussion  Summary of Progress/Problems: Patient very active in evening wrap-up group. States that she is sad that her twin children have a birthday tomorrow but knows she needs to be here. Despina Boan Nanta Eron Staat 08/10/2015, 9:19 PM

## 2015-08-10 NOTE — Clinical Social Work Note (Signed)
Clinical Social Work Assessment  Patient Details  Name: Teresa MendCortney N Tamblyn MRN: [id number] Date of Birth: 11/23/1985  [facility] Floydene Flockancy Batchelor [phone] came by to meet with patient as patient gave verbal permission to do yesterday 08/09/15. CSW also spoke with patient's boyfriend Freddi StarrMartin McCraven [phone] who states he feels that patient should not be discharged and needs to go to treatment and was not very comfortable with only 2 weeks that she will receive at [facility]. [facility] [phone] will call as soon as a bed is available but no bed at this time.   Lulu Ridingngle, Riad Wagley T, LCSW 08/10/2015, 2:40 PM

## 2015-08-11 MED ORDER — OLANZAPINE 5 MG PO TABS: 5.0000 mg | ORAL_TABLET | Freq: Three times a day (TID) | ORAL | Status: AC | PRN

## 2015-08-11 MED ORDER — HYDROXYZINE HCL 50 MG PO TABS: 50.0000 mg | ORAL_TABLET | Freq: Every day | ORAL | Status: AC

## 2015-08-11 MED ORDER — OLANZAPINE 10 MG PO TBDP: 10.0000 mg | ORAL_TABLET | Freq: Two times a day (BID) | ORAL | Status: DC | PRN

## 2015-08-11 MED ORDER — FLUVOXAMINE MALEATE 100 MG PO TABS
100.0000 mg | ORAL_TABLET | Freq: Two times a day (BID) | ORAL | Status: AC
Start: 2015-08-11 — End: ?

## 2015-08-11 NOTE — [facility] Group Notes (Signed)
[facility] Group Notes:  (Nursing/MHT/Case Management/Adjunct)  Date:  08/11/2015  Time:  1:50 PM  Type of Therapy:  Group Therapy  Participation Level:  Active  Participation Quality:  Appropriate, Attentive, Sharing and Supportive  Affect:  Appropriate  Cognitive:  Alert, Appropriate and Oriented  Insight:  Appropriate  Engagement in Group:  Engaged  Modes of Intervention:  Discussion and Socialization  Summary of Progress/Problems:  Shaunda Tipping De'Chelle Aleena Kirkeby 08/11/2015, 1:50 PM
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
## 2015-08-11 NOTE — Progress Notes (Signed)
Patient with anxious affect, cooperative with meals and meds. Denies SI/HI at this time. Patient with poor coping skills, remains focused on meds States she is "unable to attend therapy groups rt it makes her nervous and OCD kicks in. Rests in bed a lot and social with peers. Safety maintained.

## 2015-08-11 NOTE — Progress Notes (Signed)
[facility] MD Progress Note  08/11/2015 12:22 PM Teresa Hunt  MRN:  [id number] Subjective:  Today she says she is sad because is her twin's birthday. She wishes to be discharged today so she can spend their birthday together.  Patient says she will stay with her father until she gets to [facility].  Per Child psychotherapist as of this morning [facility] does not have a bed available yet.  Patient says she feels anxious. Feels she's gonna have a panic attack. Syas she cannot attend groups because it makes her anxiety and OCD worse. She is frequently asking nurses for Zyprexa and Vistaril when necessary.   Per Child psychotherapist [facility] has her in the waiting list and they will admit her as soon as they have a discharge.     Monday 3/20:  patient went to the nurses and told them she was having thoughts about hanging herself. She felt guilty about her substance use and how that was affecting her children. She was thinking that everybody her family would be better off without her. She requested medications to help her calm down. She also reported to nurses that she was abusing Xanax at home and was actually injecting them. Nurses move her closer to the nurses station for close monitoring. She receive olanzapine once.   Per nursing: Patient with anxious affect, cooperative with meals and meds. Denies SI/HI at this time. Patient with poor coping skills, remains focused on meds States she is "unable to attend therapy groups rt it makes her nervous and OCD kicks in. Rests in bed a lot and social with peers. Safety maintained.   Principal Problem: Major depression, recurrent (HCC) Diagnosis:   Patient Active Problem List   Diagnosis Date Noted  . 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 Major depression, recurrent (HCC) [F33.9] 08/07/2015  . Cannabis use disorder, moderate, dependence (HCC) [F12.20] 07/11/2015  . 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 Cocaine use disorder, moderate,  dependence (HCC) [F14.20] 07/11/2015  . Opioid use disorder, moderate, in controlled environment, dependence (HCC) [F11.20] 07/11/2015  . Tobacco use disorder [F17.200] 07/11/2015   Total Time spent with patient: 30 minutes   Past Medical History:  Past Medical History  Diagnosis Date  . UTI (lower urinary tract infection)   . Drug abuse   . Bipolar affect, depressed (HCC)   . Asthma   . Heart murmur     Past Surgical History  Procedure Laterality Date  . Cesarean section    . Exploratory laparotomy     Social History:  History  Alcohol Use  . Yes    Comment: sometimes     History  Drug Use  . Yes  . Special: IV, Cocaine, Marijuana, Benzodiazepines, Other-see comments    Social History   Social History  . Marital Status: Divorced    Spouse Name: N/A  . Number of Children: N/A  . Years of Education: N/A   Social History Main Topics  . Smoking status: Current Every Day Smoker -- 1.00 packs/day    Types: Cigarettes  . Smokeless tobacco: None  . Alcohol Use: Yes     Comment: sometimes  . Drug Use: Yes    Special: IV, Cocaine, Marijuana, Benzodiazepines, Other-see comments  . Sexual Activity: Yes   Other Topics Concern  . None   Social History Narrative   Current Medications: Current Facility-Administered Medications  Medication Dose Route Frequency Provider Last Rate Last Dose  . acetaminophen (TYLENOL) tablet 1,000 mg  1,000 mg Oral Q6H PRN Jimmy Footman, MD   1,000 mg at 08/09/15 2114  . alum & mag hydroxide-simeth (MAALOX/MYLANTA) 200-200-20 MG/5ML suspension 30 mL  30 mL Oral Q4H PRN Jimmy Footman, MD      . benzocaine (ORAJEL) 10 % mucosal gel   Mouth/Throat QID PRN Jolanta B Pucilowska, MD      . chlordiazePOXIDE (LIBRIUM) capsule 25 mg  25 mg Oral TID Jimmy Footman, MD   25 mg at 08/11/15 1610  . fluvoxaMINE (LUVOX) tablet 100 mg  100 mg Oral Daily Jimmy Footman, MD   100 mg at 08/10/15 2145  .  hydrOXYzine (ATARAX/VISTARIL) tablet 50 mg  50 mg Oral TID PRN Jimmy Footman, MD   50 mg at 08/10/15 1113  . hydrOXYzine (ATARAX/VISTARIL) tablet 50 mg  50 mg Oral QHS Jimmy Footman, MD   50 mg at 08/10/15 2146  . ibuprofen (ADVIL,MOTRIN) tablet 800 mg  800 mg Oral Q6H PRN Jimmy Footman, MD   800 mg at 08/11/15 9604  . magic mouthwash  5 mL Oral TID PC & HS Jimmy Footman, MD   5 mL at 08/10/15 1800   And  . lidocaine (XYLOCAINE) 2 % viscous mouth solution 5 mL  5 mL Mouth/Throat TID PC & HS Jimmy Footman, MD   5 mL at 08/11/15 1300  . loperamide (IMODIUM) capsule 4 mg  4 mg Oral QID PRN Jimmy Footman, MD      . magnesium hydroxide (MILK OF MAGNESIA) suspension 30 mL  30 mL Oral Daily PRN Jimmy Footman, MD      . nicotine (NICODERM CQ - dosed in mg/24 hours) patch 21 mg  21 mg Transdermal Q0600 Jimmy Footman, MD   21 mg at 08/11/15 0657  . OLANZapine zydis (ZYPREXA) disintegrating tablet 10 mg  10 mg Oral TID PRN Jimmy Footman, MD   10 mg at 08/11/15 1211  . promethazine (PHENERGAN) tablet 25 mg  25 mg Oral Q8H PRN Jimmy Footman, MD   25 mg at 08/11/15 5409    Lab Results: No results found for this or any previous visit (from the past 48 hour(s)).  Blood Alcohol level:  Lab Results  Component Value Date   ETH <5 08/06/2015   ETH <5 07/10/2015    Physical Findings: AIMS: Facial and Oral Movements Muscles of Facial Expression: None, normal Lips and Perioral Area: None, normal Jaw: None, normal Tongue: None, normal,Extremity Movements Upper (arms, wrists, hands, fingers): None, normal Lower (legs, knees, ankles, toes): None, normal, Trunk Movements Neck, shoulders, hips: None, normal, Overall Severity Severity of abnormal movements (highest score from questions above): None, normal Incapacitation due to abnormal movements: None, normal Patient's awareness of abnormal  movements (rate only patient's report): No Awareness, Dental Status Current problems with teeth and/or dentures?: No Does patient usually wear dentures?: No  CIWA:  CIWA-Ar Total: 5 COWS:  COWS Total Score: 4  Musculoskeletal: Strength & Muscle Tone: within normal limits Gait & Station: normal Patient leans: N/A  Psychiatric Specialty Exam: Review of Systems  Constitutional: Negative.   HENT: Negative.   Eyes: Negative.   Respiratory: Negative.   Cardiovascular: Negative.   Gastrointestinal: Negative.   Genitourinary: Negative.   Musculoskeletal: Negative.   Skin: Negative.   Neurological: Negative.   Endo/Heme/Allergies: Negative.   Psychiatric/Behavioral: Positive for depression and substance abuse. Negative for suicidal ideas. The patient is nervous/anxious.     Blood pressure 88/49, pulse 61, temperature 98 F (36.7 C), temperature source Oral, resp. rate 18, height  5\' 4"  (1.626 m), weight 49.215 kg (108 lb 8 oz), last menstrual period 07/10/2015, SpO2 100 %.Body mass index is 18.61 kg/(m^2).  General Appearance: Fairly Groomed  Patent attorneyye Contact::  Fair  Speech:  Clear and Coherent  Volume:  Normal  Mood:  Anxious and Dysphoric  Affect:  Constricted  Thought Process:  Linear  Orientation:  Full (Time, Place, and Person)  Thought Content:  Hallucinations: None  Suicidal Thoughts:  No  Homicidal Thoughts:  No  Memory:  Immediate;   Good Recent;   Good Remote;   Good  Judgement:  Poor  Insight:  Shallow  Psychomotor Activity:  Decreased  Concentration:  Good  Recall:  Good  Fund of Knowledge:Good  Language: Good  Akathisia:  No  Handed:    AIMS (if indicated):     Assets:  ArchitectCommunication Skills Financial Resources/Insurance Physical Health  ADL's:  Intact  Cognition: WNL  Sleep:  Number of Hours: 6.5   Treatment Plan Summary: Daily contact with patient to assess and evaluate symptoms and progress in treatment and Medication management   Patient is a  30 year old single Caucasian female who was brought into our emergency department after making suicidal threats. This patient was just discharged last month after a suicidal attempt by overdose. The patient describes herself as unpredictable and impulsive. [facility] as she continues to use cocaine and heroine. Patient states she was trying to "get high" prior to going to rehabilitation.  Family history concerned about her safety. They feel that the patient is a threat to herself as she has attempted suicide before and she is very impulsive. They feel patient is "out of control".    Where is still awaiting for a bed at [facility].  Patient is demanding discharge. We have decided to wait until tomorrow to see if a bed will open up at [facility].  If no bed available the patient will be discharged home to the care of her father with referral to intensive outpatient substance abuse  Major depressive disorder/OCD/PTSD: Continue Luvox 100 mg by mouth daily at bedtime  PTSD: should improve with luvox. No longer on prazosin as she felt "not herself" when on it.  For insomnia: continue Vistaril 50 mg by mouth daily at bedtime.   Anxiety: Patient has been started on Vistaril 50 mg every 6 hours as needed  Agitation: continue olanzapine 10 mg tid prn  Opiate dependence: Patient is currently prescribed with 8 mg of Suboxone by [facility]. I have asked the patient to tell her boyfriend to bring her Suboxone from home so we can use that medication here at the hospital. Patient is saying she has not been taking Suboxone for the last couple of days, she says that is not in her system as she last took this medication on Thursday. She is asking what will happen if her boyfriend cannot bring Suboxone ("what would happen if he can't find it?"). I have concerns that the patient might be selling or misusing the Suboxone. I will not restart Suboxone here in the hospital. We'll wait for the family  to hopefully bring her Suboxone from home. Hospital does not carry suboxone only subutex.  Opiate withdrawal: Patient says she is in withdrawal however I do not see any objective evidence of a  withdrawal. She is not reporting nausea, vomiting, diarrhea, pain and abdominal cramps . She is not sweating. Vital signs are within the normal limits she's sleeping well and eating. Again her urine toxicology  was negative for any opiates. She says she was injecting cocaine prior to admission. In case she develops symptoms of withdrawal have ordered Phenergan, Imodium and ibuprofen when necessary.  Benzodiazepine withdrawal: Patient was positive for benzodiazepines upon admission. She told nursing that she was abusing Xanax and injecting it. She has been is started on Librium taper.  Tooth pain: on Tuesday the patient reporting severe pain. Patient stated that the pain had not improved with Tylenol and ibuprofen. Explained to her that she was not going to be prescribed with opiates. Mouth wash was with lidocaine order. Has not complained about pain since then.  Tobacco use disorder: Patient nicotine patch 21 mg a day  Precautions every 15 minute checks  Hospitalization and status under involuntary commitment  Disposition: [facility] referral made.  Pt in agreement with admission to rehab.  If not bed available by tomorrow.  Will d/c home and referral for intensive outpt.  Jimmy Footman, MD 08/11/2015, 12:22 PM

## 2015-08-11 NOTE — [facility] Group Notes (Signed)
[facility] LCSW Group Therapy   08/11/2015 11am   Type of Therapy: Group Therapy   Participation Level: Active   Participation Quality: Attentive, Sharing and Supportive   Affect: Appropriate   Cognitive: Alert and Oriented   Insight: Developing/Improving and Engaged   Engagement in Therapy: Developing/Improving and Engaged   Modes of Intervention: Clarification, Confrontation, Discussion, Education, Exploration, Limit-setting, Orientation, Problem-solving, Rapport Building, Dance movement psychotherapisteality Testing, Socialization and Support   Summary of Progress/Problems: The topic for group was balance in life. Today's group focused on defining balance in one's own words, identifying things that can knock one off balance, and exploring healthy ways to maintain balance in life. Group members were asked to provide an example of a time when they felt off balance, describe how they handled that situation, and process healthier ways to regain balance in the future.  Group members were asked to share the most important tool for maintaining balance that they learned [facility] and how they plan to apply this method after discharge.  Pt shared that maintaining balance is difficult due to issues with heroin and unresolved grief due to her relationship with her mother.  Pt shared she will utilize 12-step programs and a rehab center to maintain balance and achieve her goal of remaining balanced.  Pt shared she once felt off balance after she had relapsed. Pt shared that in the future she will talk out her feelings with her therapist, seek treatment and therapy, as well as drawing healthy boundaries with her mother.  CSW actively validated the pt's opinion and provided feedback.  Pt was polite and cooperative with the CSW and other group members and focused and attentive to the topics discussed and the sharing of others.

## 2015-08-11 NOTE — [facility] Suicide Risk Assessment (Signed)
[facility]   Principal Problem: Major depression, recurrent [facility](HCC) Discharge Diagnoses:  Patient Active Problem List   Diagnosis Date Noted  . 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 Major depression, recurrent (HCC) [F33.9] 08/07/2015  . Cannabis use disorder, moderate, dependence (HCC) [F12.20] 07/11/2015  . 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 Cocaine use disorder, moderate, dependence (HCC) [F14.20] 07/11/2015  . Opioid use disorder, moderate, in controlled environment, dependence (HCC) [F11.20] 07/11/2015  . Tobacco use disorder [F17.200] 07/11/2015     Psychiatric Specialty Exam: ROS                                                         Mental Status Per Nursing Assessment::   On Admission:     Demographic Factors:  Caucasian and Unemployed  Loss Factors: Decrease in vocational status and Loss of significant relationship  Historical Factors: Prior suicide attempts and Impulsivity  Risk Reduction Factors:   Sense of responsibility to family, Living with another person, especially a relative and Positive social support  Continued Clinical Symptoms:  Alcohol/Substance Abuse/Dependencies Personality Disorders:   Cluster B Previous Psychiatric Diagnoses and Treatments  Cognitive Features That Contribute To Risk:  None    Suicide Risk:  Minimal: No identifiable suicidal ideation.  Patients presenting with no risk factors but with morbid ruminations; may be classified as minimal risk based on the severity of the depressive symptoms  Follow-up Information    Follow up with [facility]. Go on 08/12/2015.   Why:  IVC to [facility] and transport by sheriff on Friday 08/12/15 for substance abuse inpatient treatment   Contact information:   [address] [place], [place] Ph [phone] Ph [phone] Annamaria Boots(Fume) Fax [phone]        Jimmy FootmanHernandez-Gonzalez,  [place], MD 08/12/2015, 9:53  AM

## 2015-08-11 NOTE — Plan of Care (Signed)
Problem: Ineffective individual coping Goal: LTG: Patient will report a decrease in negative feelings Outcome: Progressing Patient reports this morning she would like to discharge today "it is my twins birthday". Patient with poor coping skills, not attending therapy groups, "it makes me anxious", patient remains focused on medicines.

## 2015-08-11 NOTE — Plan of Care (Signed)
Problem: Ineffective individual coping Goal: STG: Patient will remain free from self harm Outcome: Progressing Patient denies suicidal ideation.     

## 2015-08-11 NOTE — Progress Notes (Signed)
Recreation Therapy Notes  Date: 03.23.17 Time: 3:00 pm Location: Craft Room  Group Topic: Leisure Education  Goal Area(s) Addresses:  Patient will identify activities for each letter of the alphabet. Patient will verbalize ability to integrate positive leisure into life post d/c. Patient will verbalize ability to use leisure as a Associate Professorcoping skill.  Behavioral Response: Attentive, Interactive  Intervention: Leisure Alphabet  Activity: Patients were given a Leisure Information systems managerAlphabet worksheet and instructed to write a healthy leisure activity for each letter of the alphabet.  Education: LRT educated patients on what they need to participate in leisure.  Education Outcome: Acknowledges education/In group clarification offered  Clinical Observations/Feedback: Patient completed activity by writing healthy leisure activities for most of the letters. Patient wrote "eggs", "quite smoking" for some letter. Patient contributed to group discussion by stating some of the leisure activities she wrote down.  Jacquelynn CreeGreene,Saya Mccoll M, LRT/CTRS 08/11/2015 4:23 PM

## 2015-08-11 NOTE — Progress Notes (Signed)
Patient increasingly Zalar this afternoon, speaks with children on phone. Safety maintained.

## 2015-08-11 NOTE — Progress Notes (Signed)
D: Pt denies SI/HI/AVH. Pt is pleasant and cooperative. Affect is Chuang and warm. Patient rated depression 2 on a scale of 1-10. Pt appears less anxious and she is interacting with peers and staff appropriately.  A: Pt was offered support and encouragement. Pt was given scheduled medications. Pt was encouraged to attend groups. Q 15 minute checks were done for safety.  R:Pt attends groups and interacts well with peers and staff. Pt is taking medication. Pt has no complaints.Pt receptive to treatment and safety maintained on unit.

## 2015-08-12 NOTE — Tx Team (Signed)
Interdisciplinary Treatment Plan Update (Adult)  Date:  08/12/2015 Time Reviewed:  11:09 AM  Progress in Treatment: Attending groups: Yes. Participating in groups:  Yes. Taking medication as prescribed:  Yes. Tolerating medication:  Yes. Family/Significant othe contact made:  Yes, individual(s) contacted:  patient's boyfriend Patient understands diagnosis:  Yes. Discussing patient identified problems/goals with staff:  Yes. Medical problems stabilized or resolved:  Yes. Denies suicidal/homicidal ideation: Yes. Issues/concerns per patient self-inventory:  Yes. Other:  New problem(s) identified: No, Describe:  none reported  Discharge Plan or Barriers: Patient will stabilize and discharge home with outpatient follow up in [facility] or IVC to [place] if a bed is available.   Reason for Continuation of Hospitalization: Depression Medication stabilization Suicidal ideation Withdrawal symptoms  Comments:  Estimated length of stay: up to 1 day, expected discharge Friday 08/12/15  New goal(s):  Review of initial/current patient goals per problem list:   1.  Goal(s): Participate in aftercare plan   Met:  Yes  Target date: by discharge  As evidenced by: patient will participate in aftercare plan AEB aftercare provider and housing plan identified at discharge 08/09/15: Patient has identified outpatient follow up and has a home to return to that is safe for discharge. Goal met.   2.  Goal (s): Decrease depression    Met:  No  Target date: by discharge  As evidenced by: patient demonstrates decreased symptoms of depression and reports a Depression rating of 3 or less 08/09/15: Patient still passively suicidal but contracts for safety. 08/11/15: Patient is tearful and asking to discharge repeatedly throughout the day wanting to spend time with her twins birthday.   3.  Goal(s): Patient will demonstrate decreased signs of withdrawal due to substance abuse    Met:  No  Target  date: by discharge  As evidenced by: Patient will produce a CIWA/COWS score of 0, have stable vitals signs, and no symptoms of withdrawal 08/09/15: Patient remains anxious and blood pressure was 111/92 today.  08/11/15: Patient is very anxious asking to leave and discussed relapse preventions and identified triggers and physical symptoms that could lead to relapse.   Attendees: Physician:  Merlyn Albert, MD 3/24/201711:09 AM  Nursing:   Carolynn Sayers, RN 3/24/201711:09 AM  Other:  Carmell Austria, [place] 3/24/201711:09 AM  Other:   3/24/201711:09 AM  Other:   3/24/201711:09 AM  Other:  3/24/201711:09 AM  Other:  3/24/201711:09 AM  Other:  3/24/201711:09 AM  Other:  3/24/201711:09 AM  Other:  3/24/201711:09 AM  Other:  3/24/201711:09 AM  Other:   3/24/201711:09 AM   Scribe for Treatment Team:   Keene Breath, MSW, [place]  08/12/2015, 11:09 AM

## 2015-08-12 NOTE — Progress Notes (Signed)
D/C instructions/transition record/suicide risk assessmenr/meds/follow-up appointments reviewed, pt verbalized understanding, pt's belongings given to sheriff, denies HI/AVH, pt very anxious about being transported to [facility], tearful, endorses SI to hang self--MD made aware--no new orders, pt transferred to [facility] under IVC via sheriff, IVC paperwork, d/c paper, EMTALA signed and given to sheriff at time of transport, report was called to receiving facility ([facility] RN) prior to patient transport.

## 2015-08-12 NOTE — Plan of Care (Signed)
Problem: Alteration in mood & ability to function due to Goal: LTG-Patient demonstrates decreased signs of withdrawal (Patient demonstrates decreased signs of withdrawal to the point the patient is safe to return home and continue treatment in an outpatient setting)  Outcome: Progressing Patient states she feels less anxious.

## 2015-08-12 NOTE — Progress Notes (Signed)
  [facility] :  Will you be returning to the same living situation after discharge:  Yes,  but will go to [facility] for inpatient substance abuse Tx before going home At discharge, do you have transportation home?: No. [facility] will provide transport as patient is IVC Do you have the ability to pay for your medications: Yes,  patient has Medicaid  Release of information consent forms completed and in the chart;  Patient's signature needed at discharge.  Patient to Follow up at: Follow-up Information    Follow up with [facility]. Go on 08/12/2015.   Why:  IVC to [facility] and transport by sheriff on Friday 08/12/15 for substance abuse inpatient treatment   Contact information:   [address] [place], [place] Ph [phone] Ph [phone] Annamaria Boots(Fume) Fax [phone]       Next level of care provider [facility] Link:no  Safety Planning and Suicide Prevention discussed: Yes,  SPE discussed with patient and Freddi StarrMartin McCraven (boyfriend) [phone]   Have you used any form of tobacco in the last 30 days? (Cigarettes, Smokeless Tobacco, Cigars, and/or Pipes): Yes  Has patient been referred to the Quitline?: Patient refused referral  Patient has been referred for addiction treatment: Yes  Lulu RidingIngle, Dohn Stclair T, MSW, LCSWA 08/12/2015, 11:14 AM

## 2015-08-12 NOTE — Progress Notes (Signed)
D: Patient appears much brighter this evening and less anxious. She states she's ready to get home to her children. She denies SI/HI/AVH. She also no longer complains of pain in her tooth.  A: Medication given with education. Encouragement provided.  R: Patient compliant with medication. She has remained calm and cooperative. Safety maintained with 15 min checks.

## 2015-08-12 NOTE — Tx Team (Signed)
Interdisciplinary Treatment Plan Update (Adult)  Date:  08/12/2015 Time Reviewed:  11:12 AM  Progress in Treatment: Attending groups: Yes. Participating in groups:  Yes. Taking medication as prescribed:  Yes. Tolerating medication:  Yes. Family/Significant othe contact made:  Yes, individual(s) contacted:  patient's boyfriend Patient understands diagnosis:  Yes. Discussing patient identified problems/goals with staff:  Yes. Medical problems stabilized or resolved:  Yes. Denies suicidal/homicidal ideation: Yes. Issues/concerns per patient self-inventory:  Yes. Other:  New problem(s) identified: No, Describe:  none reported  Discharge Plan or Barriers: Patient will stabilize and discharge home with outpatient follow up in [facility] or IVC to [facility] if a bed is available.   Reason for Continuation of Hospitalization: Depression Medication stabilization Suicidal ideation Withdrawal symptoms  Comments:  Estimated length of stay: 0 days, will discharge today Friday 08/12/15  New goal(s):  Review of initial/current patient goals per problem list:   1.  Goal(s): Participate in aftercare plan   Met:  Yes  Target date: by discharge  As evidenced by: patient will participate in aftercare plan AEB aftercare provider and housing plan identified at discharge 08/09/15: Patient has identified outpatient follow up and has a home to return to that is safe for discharge. Goal met.   2.  Goal (s): Decrease depression    Met:  Yes  Target date: by discharge  As evidenced by: patient demonstrates decreased symptoms of depression and reports a Depression rating of 3 or less 08/09/15: Patient still passively suicidal but contracts for safety. 08/11/15: Patient is tearful and asking to discharge repeatedly throughout the day wanting to spend time with her twins birthday.  08/12/15: Patient is stable for discharge per MD.  3.  Goal(s): Patient will demonstrate decreased signs of  withdrawal due to substance abuse    Met:  Yes  Target date: by discharge  As evidenced by: Patient will produce a CIWA/COWS score of 0, have stable vitals signs, and no symptoms of withdrawal 08/09/15: Patient remains anxious and blood pressure was 111/92 today.  08/11/15: Patient is very anxious asking to leave and discussed relapse preventions and identified triggers and physical symptoms that could lead to relapse.  08/12/15: Patient is stable for discharge per MD.   Attendees: Physician:  Merlyn Albert, MD 3/24/201711:12 AM  Nursing:   Terressa Koyanagi, RN 3/24/201711:12 AM  Other:  Carmell Austria, [place] 3/24/201711:12 AM  Other:   3/24/201711:12 AM  Other:   3/24/201711:12 AM  Other:  3/24/201711:12 AM  Other:  3/24/201711:12 AM  Other:  3/24/201711:12 AM  Other:  3/24/201711:12 AM  Other:  3/24/201711:12 AM  Other:  3/24/201711:12 AM  Other:   3/24/201711:12 AM   Scribe for Treatment Team:   Keene Breath, MSW, LCSWA  08/12/2015, 11:12 AM

## 2015-08-12 NOTE — [facility] Group Notes (Signed)
[facility] Group Notes:  (Nursing/MHT/Case Management/Adjunct)  Date:  08/12/2015  Time:  11:49 AM  Type of Therapy:  Movement Therapy  Participation Level:  Active  Participation Quality:  Appropriate and Attentive  Affect:  Appropriate  Cognitive:  Alert, Appropriate and Oriented  Insight:  Appropriate  Engagement in Group:  Engaged  Modes of Intervention:  Activity and Socialization  Summary of Progress/Problems:  Teresa Hunt [name] 08/12/2015, 11:49 AM

## 2015-08-15 NOTE — Progress Notes (Signed)
Recreation Therapy Notes  INPATIENT RECREATION TR PLAN  Patient Details Name: SYLVANA BONK MRN: [id number] DOB: [birth year] Today's Date: 08/15/2015  Rec Therapy Plan Is patient appropriate for Therapeutic Recreation?: Yes Treatment times per week: At least once a week TR Treatment/Interventions: 1:1 session, Group participation (Comment) (Appropriate participation in daily recreation therapy tx)  Discharge Criteria Pt will be discharged from therapy if:: Treatment goals are met, Discharged Treatment plan/goals/alternatives discussed and agreed upon by:: Patient/family  Discharge Summary Short term goals set: See Care Plan Short term goals met: Complete Progress toward goals comments: One-to-one attended Which groups?: Leisure education One-to-one attended: Self-esteem, coping skills Reason goals not met: N/A Therapeutic equipment acquired: None Reason patient discharged from therapy: Discharge from hospital Pt/family agrees with progress & goals achieved: Yes Date patient discharged from therapy: 08/12/15   Leonette Monarch, LRT/CTRS 08/15/2015, 12:29 PM
